# Patient Record
Sex: Female | Born: 1970 | Race: White | Hispanic: No | Smoking: Current every day smoker
Health system: Southern US, Community
[De-identification: ages and names within clinical notes are randomized; demographics above are authoritative.]

## PROBLEM LIST (undated history)

## (undated) DIAGNOSIS — D649 Anemia, unspecified: Secondary | ICD-10-CM

## (undated) DIAGNOSIS — I10 Essential (primary) hypertension: Secondary | ICD-10-CM

## (undated) DIAGNOSIS — F32A Depression, unspecified: Secondary | ICD-10-CM

## (undated) DIAGNOSIS — F419 Anxiety disorder, unspecified: Secondary | ICD-10-CM

## (undated) DIAGNOSIS — E079 Disorder of thyroid, unspecified: Secondary | ICD-10-CM

## (undated) DIAGNOSIS — F329 Major depressive disorder, single episode, unspecified: Secondary | ICD-10-CM

## (undated) DIAGNOSIS — M069 Rheumatoid arthritis, unspecified: Secondary | ICD-10-CM

## (undated) HISTORY — DX: Anemia, unspecified: D64.9

## (undated) HISTORY — PX: TUBAL LIGATION: SHX77

## (undated) HISTORY — DX: Anxiety disorder, unspecified: F41.9

## (undated) HISTORY — DX: Rheumatoid arthritis, unspecified: M06.9

## (undated) HISTORY — PX: CHOLECYSTECTOMY: SHX55

## (undated) HISTORY — PX: ENDOMETRIAL ABLATION: SHX621

---

## 2002-12-08 ENCOUNTER — Encounter (INDEPENDENT_AMBULATORY_CARE_PROVIDER_SITE_OTHER): Payer: Self-pay | Admitting: *Deleted

## 2002-12-08 ENCOUNTER — Ambulatory Visit (HOSPITAL_BASED_OUTPATIENT_CLINIC_OR_DEPARTMENT_OTHER): Admission: RE | Admit: 2002-12-08 | Discharge: 2002-12-08 | Payer: Self-pay | Admitting: General Surgery

## 2003-03-01 ENCOUNTER — Ambulatory Visit (HOSPITAL_COMMUNITY): Admission: RE | Admit: 2003-03-01 | Discharge: 2003-03-01 | Payer: Self-pay | Admitting: Obstetrics & Gynecology

## 2004-02-06 ENCOUNTER — Ambulatory Visit (HOSPITAL_BASED_OUTPATIENT_CLINIC_OR_DEPARTMENT_OTHER): Admission: RE | Admit: 2004-02-06 | Discharge: 2004-02-06 | Payer: Self-pay | Admitting: General Surgery

## 2004-07-30 ENCOUNTER — Emergency Department (HOSPITAL_COMMUNITY): Admission: EM | Admit: 2004-07-30 | Discharge: 2004-07-30 | Payer: Self-pay | Admitting: Emergency Medicine

## 2006-03-31 ENCOUNTER — Ambulatory Visit: Payer: Self-pay | Admitting: Internal Medicine

## 2006-11-10 ENCOUNTER — Emergency Department (HOSPITAL_COMMUNITY): Admission: EM | Admit: 2006-11-10 | Discharge: 2006-11-10 | Payer: Self-pay | Admitting: Emergency Medicine

## 2007-03-03 ENCOUNTER — Emergency Department (HOSPITAL_COMMUNITY): Admission: EM | Admit: 2007-03-03 | Discharge: 2007-03-03 | Payer: Self-pay | Admitting: Emergency Medicine

## 2007-08-27 ENCOUNTER — Emergency Department (HOSPITAL_COMMUNITY): Admission: EM | Admit: 2007-08-27 | Discharge: 2007-08-28 | Payer: Self-pay | Admitting: Emergency Medicine

## 2010-02-16 ENCOUNTER — Emergency Department (HOSPITAL_COMMUNITY): Admission: EM | Admit: 2010-02-16 | Discharge: 2010-02-16 | Payer: Self-pay | Admitting: Emergency Medicine

## 2011-05-09 NOTE — Op Note (Signed)
Kathy Singh, Kathy Singh                       ACCOUNT NO.:  [id number]   MEDICAL RECORD NO.:  [id number]                   PATIENT TYPE:  AMB   LOCATION:  DSC                                  FACILITY:  [facility]   PHYSICIAN:  Gita Kudo, M.D.              DATE OF BIRTH:  Dec 04, 1971   DATE OF PROCEDURE:  02/06/2004  DATE OF DISCHARGE:                                 OPERATIVE REPORT   PROCEDURE PERFORMED:  Excision recurrent sebaceous cyst, occiput.   SURGEON:  Gita Kudo, M.D.   ANESTHESIA:  1% Xylocaine with epinephrine.   DESCRIPTION OF PROCEDURE:  The patient was positioned in the prone position  and her head shaved appropriately.  The area was prepped with Betadine and  infiltrated with local anesthesia for good analgesia.  Then the area in  question which had been previously palpated was excised with an ellipse  encompassing the external draining site and internally the entire cyst.  It  was a recurrent cyst and I believe that we got it all out.  There was some  scalp bleeding.  There was no gross pus and I removed the area totally--so  therefore, I thought it safe to close it.  It also acted for hemostasis.  The wound was closed with three widely placed 2-0 Monocryl mattress sutures,  a light pressure dressing applied.  Written instructions given.  The patient  will be followed in the office.                                               Gita Kudo, M.D.    MRL/MEDQ  D:  02/06/2004  T:  02/06/2004  Job:  [id number]

## 2011-05-09 NOTE — Op Note (Signed)
   NAMEAYLINE, DINGUS                       ACCOUNT NO.:  [id number]   MEDICAL RECORD NO.:  [id number]                   PATIENT TYPE:  AMB   LOCATION:  DSC                                  FACILITY:  [facility]   PHYSICIAN:  Gabrielle Dare. Janee Morn, M.D.             DATE OF BIRTH:  December 11, 1971   DATE OF PROCEDURE:  12/08/2002  DATE OF DISCHARGE:                                 OPERATIVE REPORT   PREOPERATIVE DIAGNOSIS:  Mass on the occiput.   POSTOPERATIVE DIAGNOSIS:  Sebaceous cyst on the occiput.   PROCEDURE:  Excision of sebaceous cyst on the occiput.   SURGEON:  Gabrielle Dare. Janee Morn, M.D.   ANESTHESIA:  Monitored anesthesia care.   HISTORY OF PRESENT ILLNESS:  The patient is a 40 year old female who  presented complaining of an enlarging mass on her occiput, and she was  scheduled for elective excision.   PROCEDURE IN DETAIL:  The patient was brought to the operating room.  She  had received intravenous antibiotics.  The area around this mass was shaved.  The mass was about 3 cm in size.  Subsequently, the area was prepped and  draped in sterile fashion, and 0.25% Marcaine with epinephrine was given for  local anesthetic.  MAC anesthesia was administered.  Subsequently, an  incision was made transversely across this lesion.  It was noted to be more  consistent with a sebaceous cyst.  A portion of it was opened during the  dissection when some fluid and sebaceous material were drained out.  Subsequently, the cyst itself was removed sharply circumferentially in order  to obtain all portions.  It did fragment in a couple of places; but under  close inspection, the entire cyst was removed.  The area was then copiously  irrigated.  Bovie cautery was used for good hemostasis.  Subsequently, some  further local anesthetic was injected.  The wound was again irrigated and re-  checked for hemostasis.  This was obtained with Bovie cautery.  The wound  was subsequently closed in the following  fashion:  A 3-0 Vicryl suture was  used to approximate the subcutaneous tissues and tack them down to the  underlying tissue to close down the cavity.  Then the skin was closed with a  series of interrupted 3-0 nylon sutures.  Antibiotic ointment was applied.  The sponge and needle counts were all correct. The patient tolerated the  procedure well and was taken to the recovery room in stable condition.                                               Gabrielle Dare Janee Morn, M.D.    BET/MEDQ  D:  12/08/2002  T:  12/09/2002  Job:  [id number]

## 2011-05-09 NOTE — Op Note (Signed)
   NAME:  Kathy Singh, Kathy Singh                       ACCOUNT NO.:  [id number]   MEDICAL RECORD NO.:  [id number]                   PATIENT TYPE:  AMB   LOCATION:  DAY                                  FACILITY:  [facility]   PHYSICIAN:  Lazaro Arms, M.D.                DATE OF BIRTH:  1971-03-16   DATE OF PROCEDURE:  03/01/2003  DATE OF DISCHARGE:                                 OPERATIVE REPORT   PREOPERATIVE DIAGNOSIS:  1. Hypermenorrhea.  2. Dysmenorrhea.  3. Irregular menstrual bleeding.   POSTOPERATIVE DIAGNOSES:  1. Hypermenorrhea.  2. Dysmenorrhea.  3. Irregular menstrual bleeding.   PROCEDURE:  A hysteroscopy and D&C with endometrial ablation.   SURGEON:  Lazaro Arms, M.D.   ANESTHESIA:  General endotracheal.   FINDINGS:  The patient had a normal endometrium.  She has recently been  menstruating.  There were no polyps, no other abnormalities seen, no  fibroids.   DESCRIPTION OF PROCEDURE:  The patient was taken to the operating room and  placed in the supine position where she underwent general endotracheal  anesthesia.  She was placed in the dorsal lithotomy position and prepped and  draped in the usual sterile fashion.  The bladder was drained.  Drapes were  placed.   The cervix was grasped with a single-tooth tenaculum.  Marcaine 0.5% 10 cc  with 1:200,000 epinephrine was placed bilaterally as a paracervical block.  The cervix was dilated serially.  The uterus sounded to 11 cm.  A  hysteroscopy was performed.  No abnormal findings.  D&C was performed  without difficulty, with good uterine cry in all areas.  Endometrial  ablation was then performed using the ThermaChoice system.  It was primed  with 15 cc and taken to a -150 mmHg pressure and placed in the endometrial  cavity, 22 cc was required to distend the endometrium to maintain a pressure  of above 160 mmHg.  It was then heated up to 170 degrees Fahrenheit for a  total of 8 minutes, total therapy time of 10  minutes 23 seconds.  The  patient tolerated the procedure well.  All fluid was removed from the  endometrial ablation catheter and was accounted for.  The patient was taken  to the recovery room in good and stable condition.  All counts were correct  x3.                                               Lazaro Arms, M.D.   Loraine Maple  D:  03/01/2003  T:  03/01/2003  Job:  [id number]

## 2011-10-03 LAB — BASIC METABOLIC PANEL
CO2: 22
Calcium: 8.9
GFR calc Af Amer: >60
Potassium: 3.2 — ABNORMAL LOW
Sodium: 137

## 2011-10-03 LAB — URINALYSIS, ROUTINE W REFLEX MICROSCOPIC
Bilirubin Urine: NEGATIVE
Glucose, UA: NEGATIVE
Specific Gravity, Urine: 1.015
pH: 6

## 2011-10-03 LAB — DIFFERENTIAL
Basophils Relative: 0
Eosinophils Absolute: 0.1
Lymphocytes Relative: 22
Neutrophils Relative %: 73

## 2011-10-03 LAB — CBC
HCT: 42.5
Hemoglobin: 14.4
MCHC: 34
MCV: 87.6
Platelets: 186
RBC: 4.85
RDW: 14.9 — ABNORMAL HIGH

## 2011-10-03 LAB — URINE MICROSCOPIC-ADD ON

## 2012-03-06 ENCOUNTER — Encounter (HOSPITAL_COMMUNITY): Payer: Self-pay | Admitting: *Deleted

## 2012-03-06 ENCOUNTER — Emergency Department (HOSPITAL_COMMUNITY): Payer: Self-pay

## 2012-03-06 ENCOUNTER — Emergency Department (HOSPITAL_COMMUNITY)
Admission: EM | Admit: 2012-03-06 | Discharge: 2012-03-06 | Disposition: A | Discharge status: Home / Self Care | Payer: Self-pay | Attending: Emergency Medicine | Admitting: Emergency Medicine

## 2012-03-06 ENCOUNTER — Other Ambulatory Visit: Payer: Self-pay

## 2012-03-06 DIAGNOSIS — Z9889 Other specified postprocedural states: Secondary | ICD-10-CM | POA: Insufficient documentation

## 2012-03-06 DIAGNOSIS — R0602 Shortness of breath: Secondary | ICD-10-CM | POA: Insufficient documentation

## 2012-03-06 DIAGNOSIS — F17200 Nicotine dependence, unspecified, uncomplicated: Secondary | ICD-10-CM | POA: Insufficient documentation

## 2012-03-06 DIAGNOSIS — R61 Generalized hyperhidrosis: Secondary | ICD-10-CM | POA: Insufficient documentation

## 2012-03-06 DIAGNOSIS — R748 Abnormal levels of other serum enzymes: Secondary | ICD-10-CM

## 2012-03-06 DIAGNOSIS — E079 Disorder of thyroid, unspecified: Secondary | ICD-10-CM | POA: Insufficient documentation

## 2012-03-06 DIAGNOSIS — R079 Chest pain, unspecified: Secondary | ICD-10-CM | POA: Insufficient documentation

## 2012-03-06 DIAGNOSIS — M069 Rheumatoid arthritis, unspecified: Secondary | ICD-10-CM | POA: Insufficient documentation

## 2012-03-06 HISTORY — DX: Disorder of thyroid, unspecified: E07.9

## 2012-03-06 LAB — DIFFERENTIAL
Eosinophils Absolute: 0.1 10*3/uL (ref 0.0–0.7)
Lymphocytes Relative: 27 % (ref 12–46)
Lymphs Abs: 1.3 10*3/uL (ref 0.7–4.0)
Neutrophils Relative %: 64 % (ref 43–77)

## 2012-03-06 LAB — COMPREHENSIVE METABOLIC PANEL
ALT: 272 U/L — ABNORMAL HIGH (ref 0–35)
Alkaline Phosphatase: 126 U/L — ABNORMAL HIGH (ref 39–117)
BUN: 8 mg/dL (ref 6–23)
Calcium: 9.6 mg/dL (ref 8.4–10.5)
Creatinine, Ser: 0.6 mg/dL (ref 0.50–1.10)
GFR calc non Af Amer: >90 mL/min (ref >90)
Glucose, Bld: 109 mg/dL — ABNORMAL HIGH (ref 70–99)
Total Bilirubin: 0.4 mg/dL (ref 0.3–1.2)
Total Protein: 7.9 g/dL (ref 6.0–8.3)

## 2012-03-06 LAB — CBC
HCT: 42.9 % (ref 36.0–46.0)
MCH: 30.2 pg (ref 26.0–34.0)
MCHC: 34 g/dL (ref 30.0–36.0)
Platelets: 206 10*3/uL (ref 150–400)
RDW: 13.8 % (ref 11.5–15.5)

## 2012-03-06 MED ORDER — OXYCODONE-ACETAMINOPHEN 5-325 MG PO TABS: 1.0000 | ORAL_TABLET | Freq: Four times a day (QID) | ORAL | Status: AC | PRN

## 2012-03-06 MED ORDER — MORPHINE SULFATE 2 MG/ML IJ SOLN
2.0000 mg | Freq: Once | INTRAMUSCULAR | Status: AC
  Administered 2012-03-06: 2 mg via INTRAVENOUS
  Filled 2012-03-06: qty 1

## 2012-03-06 MED ORDER — RANITIDINE HCL 150 MG PO CAPS: 150.0000 mg | ORAL_CAPSULE | Freq: Two times a day (BID) | ORAL | Status: AC

## 2012-03-06 MED ORDER — ASPIRIN 325 MG PO TABS
325.0000 mg | ORAL_TABLET | Freq: Once | ORAL | Status: AC
  Administered 2012-03-06: 325 mg via ORAL
  Filled 2012-03-06: qty 1

## 2012-03-06 MED ORDER — IOHEXOL 350 MG/ML SOLN
100.0000 mL | Freq: Once | INTRAVENOUS | Status: AC | PRN
  Administered 2012-03-06: 100 mL via INTRAVENOUS

## 2012-03-06 NOTE — ED Provider Notes (Signed)
History  Scribed for Kathy Lennert, MD, the patient was seen in room APA18/APA18. This chart was scribed by Kathy Singh. The patient's care started at 9:26 AM    CSN: [id number]  Arrival date & time 03/06/12  0909   First MD Initiated Contact with Patient 03/06/12 [phone]      Chief Complaint  Patient presents with  . Chest Pain     Patient is a 41 y.o. female presenting with chest pain. The history is provided by the patient.  Chest Pain The chest pain began yesterday. Chest pain occurs intermittently. The chest pain is unchanged. Associated with: nothing. The severity of the pain is moderate. The quality of the pain is described as aching and heavy. The pain radiates to the left arm. Exacerbated by: nothing. Primary symptoms include shortness of breath.  Associated symptoms include diaphoresis. She tried nothing for the symptoms. Risk factors include smoking/tobacco exposure (family history ).  Her family medical history is significant for CAD in family.    Kathy Singh is a 41 y.o. female who presents to the Emergency Department complaining of intermittent episodes of chest pain in the middle of her chest yesterday and once this morning.  She is also experiencing diaphoresis, SOB, and radiation of pain to her neck and arms.  She denies ever having these pains before.  She did not take anything for the sx and nothing seems to make better or worse.  She has family h/o of heart disease, both parents with her father having open heart surgery at 79.  Pt is a smoker and currently does not have a PCP.  She has a h/o rheumatoid arthritis.  She also states that for the last week she has not been feeling well.        Past Medical History  Diagnosis Date  . Arthritis   . Thyroid disease     Past Surgical History  Procedure Date  . Cholecystectomy   . Cesarean section     History reviewed. No pertinent family history.  History  Substance Use Topics  . Smoking status: Current  Everyday Smoker -- 1.0 packs/day  . Smokeless tobacco: Not on file  . Alcohol Use: Yes     occasionally    OB History    Grav Para Term Preterm Abortions TAB SAB Ect Mult Living                  Review of Systems  Constitutional: Positive for diaphoresis.  Respiratory: Positive for shortness of breath.   Cardiovascular: Positive for chest pain. Negative for leg swelling.  All other systems reviewed and are negative.    Allergies  Review of patient's allergies indicates not on file.  Home Medications  No current outpatient prescriptions on file.  BP 136/87  Pulse 84  Temp(Src) 97.8 F (36.6 C) (Oral)  Resp 18  Ht 5\' 4"  (1.626 m)  Wt 235 lb (106.595 kg)  BMI 40.34 kg/m2  SpO2 96%  Physical Exam  Nursing note and vitals reviewed. Constitutional: She is oriented to person, place, and time. She appears well-developed and well-nourished. No distress.  HENT:  Head: Normocephalic and atraumatic.  Eyes: EOM are normal. Right eye exhibits no discharge. Left eye exhibits no discharge.  Neck: Normal range of motion. Neck supple.  Cardiovascular: Normal rate and regular rhythm.   Pulmonary/Chest: Effort normal. No respiratory distress.  Abdominal: She exhibits no mass. There is no tenderness.  Musculoskeletal: She exhibits no edema.  Neurological: She  is alert and oriented to person, place, and time.  Skin: Skin is warm and dry. She is not diaphoretic.  Psychiatric: She has a normal mood and affect. Her behavior is normal.    ED Course  Procedures   DIAGNOSTIC STUDIES: Oxygen Saturation is 96% on room air, normal by my interpretation.    COORDINATION OF CARE:  9:35AM Ordered: CBC ; Differential ; Comprehensive metabolic panel ; Troponin I ; morphine 2 MG/ML injection 2 mg ; aspirin tablet 325 mg ; DG Chest 2 View; D-DIMER, QUANTITATIVE   10:57AM Ordered: CT Angio Chest W/Cm &/Or Wo Cm  12:46PM Ordered: Troponin I  2:05 PM Recheck: Discussed course of care with  pt.     Labs Reviewed  COMPREHENSIVE METABOLIC PANEL - Abnormal; Notable for the following:    Sodium 133 (*)    Glucose, Bld 109 (*)    AST 228 (*)    ALT 272 (*)    Alkaline Phosphatase 126 (*)    All other components within normal limits  D-DIMER, QUANTITATIVE - Abnormal; Notable for the following:    D-Dimer, Quant 3.03 (*)    All other components within normal limits  CBC  DIFFERENTIAL  TROPONIN I    Dg Chest 2 View  03/06/2012  *RADIOLOGY REPORT*  Clinical Data: Pain  CHEST - 2 VIEW  Comparison: 10/05/2009  Findings: Cardiomediastinal silhouette is stable.  No acute infiltrate or pleural effusion.  No pulmonary edema.  Mild degenerative changes thoracic spine.  IMPRESSION: No active disease.  No significant change.  Original Report Authenticated By: Natasha Mead, M.D.   Ct Angio Chest W/cm &/or Wo Cm  03/06/2012  *RADIOLOGY REPORT*  Clinical Data: Chest pain, shortness of breath  CT ANGIOGRAPHY CHEST  Technique:  Multidetector CT imaging of the chest using the standard protocol during bolus administration of intravenous contrast. Multiplanar reconstructed images including MIPs were obtained and reviewed to evaluate the vascular anatomy.  Contrast: OMNIPAQUE IOHEXOL 350 MG/ML IV SOLN  Comparison: None.  Findings: Images of the thoracic inlet are unremarkable.  Central airways are patent.  Central thoracic aorta is unremarkable.  There is no evidence of aneurysm.  The pulmonary artery is unremarkable. No pulmonary embolus is identified.  Small hiatal hernia is noted.  There is no mediastinal hematoma or adenopathy.  No hilar adenopathy.  The visualized upper abdomen is unremarkable.  The patient is status post cholecystectomy.  Images of the lung parenchyma shows no acute infiltrate or pleural effusion.  No pulmonary edema.  There is no focal consolidation. No pneumothorax.  No destructive bony lesions are noted.  IMPRESSION:  1.  No pulmonary embolus. 2.  No acute infiltrate or  pulmonary edema.  No adenopathy. 3.  Mild degenerative changes thoracic spine.  Original Report Authenticated By: Natasha Mead, M.D.     No results found.   No diagnosis found.   Date: 03/06/2012  Rate: 82  Rhythm: normal sinus rhythm  QRS Axis: normal  Intervals: normal  ST/T Wave abnormalities: nonspecific ST changes  Conduction Disutrbances:nonspecific intraventricular conduction delay  Narrative Interpretation:   Old EKG Reviewed: none available    MDM  Chest pain normal troponin times two.  Pt does not want admission now.  Will follow up with out pt work up The chart was scribed for me under my direct supervision.  I personally performed the history, physical, and medical decision making and all procedures in the evaluation of this patient.Marland Kitchen  Kathy Lennert, MD 03/06/12 [phone]

## 2012-03-06 NOTE — Discharge Instructions (Signed)
Take one baby aspirin a day.  Follow up with [facility] in 2 weeks.  Dr. Dietrich Pates is a cardiologist for [facility].  Follow up with Dr. Karilyn Cota for elevated liver tests

## 2012-03-06 NOTE — ED Notes (Signed)
Pt c/o cp. Pt states pain began last pm and has come and gone since. Pt describes pain as aching and heavy. Pt states she has had hot/cold flashes. Pt states pain radiates to her neck.

## 2012-12-27 IMAGING — CT CT ANGIO CHEST
2 of 6 series · 6 of 36 positions shown · IV contrast (Omnipaque 300)
Comparison: None.

CLINICAL DATA: Chest pain, shortness of breath

CT ANGIOGRAPHY CHEST
TECHNIQUE: Multidetector CT imaging of the chest using the
standard protocol during bolus administration of intravenous
contrast. Multiplanar reconstructed images including MIPs were
obtained and reviewed to evaluate the vascular anatomy.
Contrast: 100mL OMNIPAQUE IOHEXOL 350 MG/ML IV SOLN

[Series 4: pe 3.0 b40f · axial · 0.69mm/px · z∈[-246,-54]mm · 5 of 96 slices shown]
[im 16/96  lung]
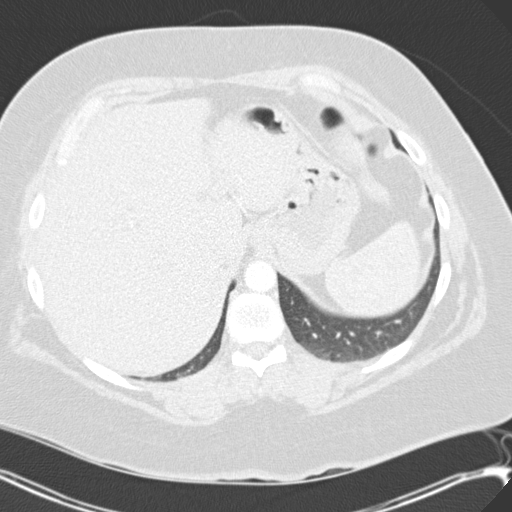
[im 32/96  mediastinal]
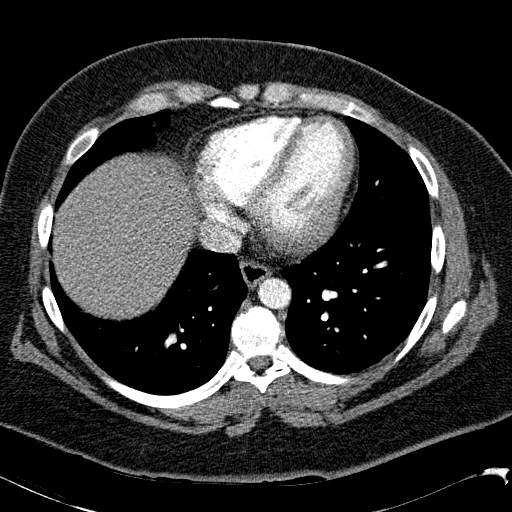
[im 48/96  lung]
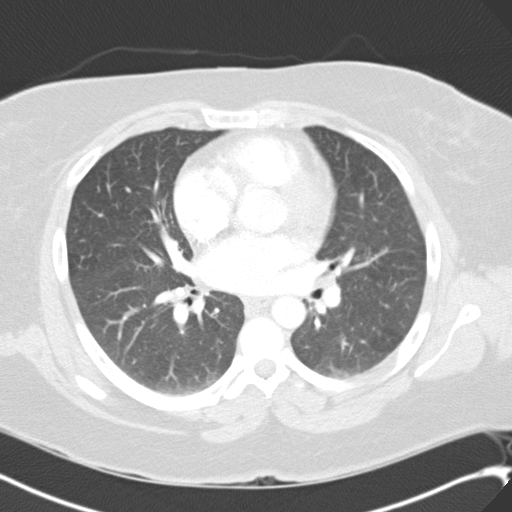
[im 64/96  mediastinal]
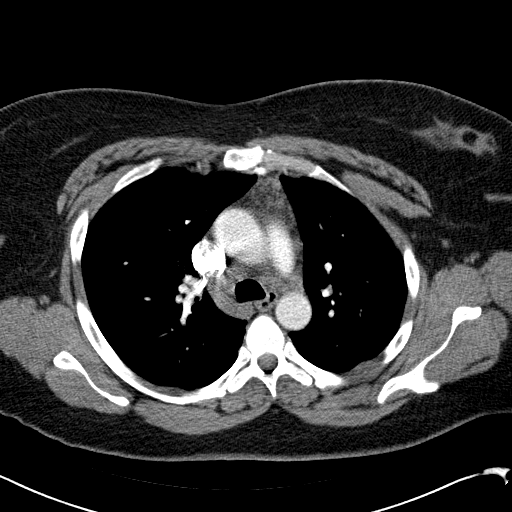
[im 80/96  lung]
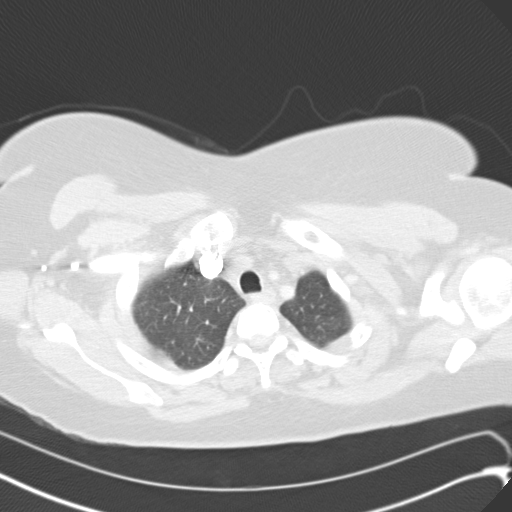

[Series 6: mpr coronal pe 3mm · coronal · 0.58mm/px · 1 of 87 slices shown]
[im 44/87  mediastinal]
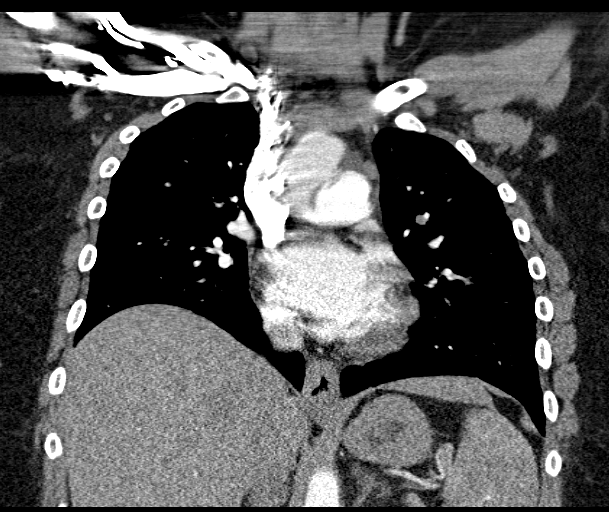

[6 of 36 positions shown; findings below may reference images not displayed]

FINDINGS: Images of the thoracic inlet are unremarkable.  Central
airways are patent.  Central thoracic aorta is unremarkable.  There
is no evidence of aneurysm.  The pulmonary artery is unremarkable.
No pulmonary embolus is identified.

Small hiatal hernia is noted.

There is no mediastinal hematoma or adenopathy.  No hilar
adenopathy.

The visualized upper abdomen is unremarkable.

The patient is status post cholecystectomy.

Images of the lung parenchyma shows no acute infiltrate or pleural
effusion.  No pulmonary edema.  There is no focal consolidation.
No pneumothorax.  No destructive bony lesions are noted.
IMPRESSION: 1.  No pulmonary embolus.
2.  No acute infiltrate or pulmonary edema.  No adenopathy.
3.  Mild degenerative changes thoracic spine.

## 2013-03-09 ENCOUNTER — Emergency Department (HOSPITAL_COMMUNITY)
Admission: EM | Admit: 2013-03-09 | Discharge: 2013-03-09 | Disposition: A | Discharge status: Home / Self Care | Payer: Medicaid Other | Attending: Emergency Medicine | Admitting: Emergency Medicine

## 2013-03-09 ENCOUNTER — Encounter (HOSPITAL_COMMUNITY): Payer: Self-pay

## 2013-03-09 DIAGNOSIS — M1990 Unspecified osteoarthritis, unspecified site: Secondary | ICD-10-CM

## 2013-03-09 DIAGNOSIS — R21 Rash and other nonspecific skin eruption: Secondary | ICD-10-CM | POA: Insufficient documentation

## 2013-03-09 DIAGNOSIS — M129 Arthropathy, unspecified: Secondary | ICD-10-CM | POA: Insufficient documentation

## 2013-03-09 DIAGNOSIS — Z8639 Personal history of other endocrine, nutritional and metabolic disease: Secondary | ICD-10-CM | POA: Insufficient documentation

## 2013-03-09 DIAGNOSIS — F17200 Nicotine dependence, unspecified, uncomplicated: Secondary | ICD-10-CM | POA: Insufficient documentation

## 2013-03-09 DIAGNOSIS — M542 Cervicalgia: Secondary | ICD-10-CM | POA: Insufficient documentation

## 2013-03-09 DIAGNOSIS — Z862 Personal history of diseases of the blood and blood-forming organs and certain disorders involving the immune mechanism: Secondary | ICD-10-CM | POA: Insufficient documentation

## 2013-03-09 LAB — GLUCOSE, CAPILLARY: Glucose-Capillary: 78 mg/dL (ref 70–99)

## 2013-03-09 MED ORDER — PREDNISONE 50 MG PO TABS
60.0000 mg | ORAL_TABLET | Freq: Once | ORAL | Status: AC
  Administered 2013-03-09: 60 mg via ORAL
  Filled 2013-03-09: qty 1

## 2013-03-09 MED ORDER — OXYCODONE-ACETAMINOPHEN 5-325 MG PO TABS: 1.0000 | ORAL_TABLET | ORAL | Status: DC | PRN

## 2013-03-09 MED ORDER — OXYCODONE-ACETAMINOPHEN 5-325 MG PO TABS
1.0000 | ORAL_TABLET | Freq: Once | ORAL | Status: AC
  Administered 2013-03-09: 1 via ORAL
  Filled 2013-03-09: qty 1

## 2013-03-09 MED ORDER — PREDNISONE 50 MG PO TABS: ORAL_TABLET | ORAL | Status: DC

## 2013-03-09 NOTE — ED Provider Notes (Signed)
History     This chart was scribed for Joya Gaskins, MD, MD by Smitty Pluck, ED Scribe. The patient was seen in room APA11/APA11 and the patient's care was started at 12:46 PM.   CSN: [id number]  Arrival date & time 03/09/13  1126      Chief Complaint  Patient presents with  . Joint Pain  . Rash     Patient is a 42 y.o. female presenting with rash. The history is provided by the patient. No language interpreter was used.  Rash Location:  Face Facial rash location:  L cheek Severity:  Mild Onset quality:  Sudden Timing:  Intermittent Progression:  Waxing and waning Chronicity:  New Context: not animal contact, not chemical exposure, not exposure to similar rash, not food, not insect bite/sting, not medications and not new detergent/soap   Relieved by:  None tried Worsened by:  Nothing tried Ineffective treatments:  None tried Associated symptoms: joint pain    Kathy Singh is a 42 y.o. female with hx of RA who presents to the Emergency Department complaining of constant, moderate bilateral knee pain , bilateral wrists and neck pain onset 1 day ago. She reports having burning sensation in knees and neck. She reports having facial rash on left cheek onset 1 week ago but symptoms seemed to improved then reappeared last night. She states the rash is itchy.  Pt denies injury, fall, fever, chills, nausea, vomiting, diarrhea, weakness, cough, and any other pain.   Past Medical History  Diagnosis Date  . Arthritis   . Thyroid disease     Past Surgical History  Procedure Laterality Date  . Cholecystectomy    . Cesarean section    . Endometrial ablation      No family history on file.  History  Substance Use Topics  . Smoking status: Current Every Day Smoker -- 1.00 packs/day  . Smokeless tobacco: Not on file  . Alcohol Use: Yes     Comment: occasionally    OB History   Grav Para Term Preterm Abortions TAB SAB Ect Mult Living                  Review of  Systems  HENT: Positive for neck pain.   Musculoskeletal: Positive for arthralgias.  Skin: Positive for rash.  All other systems reviewed and are negative.    Allergies  Penicillins  Home Medications   Current Outpatient Rx  Name  Route  Sig  Dispense  Refill  . acetaminophen (TYLENOL) 500 MG tablet   Oral   Take 1,000 mg by mouth every 6 (six) hours as needed. For pain         . ibuprofen (ADVIL,MOTRIN) 200 MG tablet   Oral   Take 800 mg by mouth every 6 (six) hours as needed. For pain           BP 125/89  Pulse 80  Temp(Src) 97.5 F (36.4 C) (Oral)  Resp 20  Ht 5\' 6"  (1.676 m)  Wt 230 lb (104.327 kg)  BMI 37.14 kg/m2  SpO2 97%  Physical Exam  Nursing note and vitals reviewed. CONSTITUTIONAL: Well developed/well nourished HEAD: Normocephalic/atraumatic EYES: EOMI/PERRL ENMT: Mucous membranes moist, healing scab to left cheek, no signs of abscess or cellulitis, no crepitance or facial swelling noted  NECK: supple no meningeal signs SPINE:entire spine nontender CV: S1/S2 noted, no murmurs/rubs/gallops noted LUNGS: Lungs are clear to auscultation bilaterally, no apparent distress ABDOMEN: soft, nontender, no rebound or guarding GU:no  cva tenderness NEURO: Pt is awake/alert, moves all extremitiesx4, pt is ambulatory EXTREMITIES: pulses normal, full ROM, tenderness to bilateral wrists, no warmth or edema, no tenderness noted lower extremities SKIN: warm, color normal PSYCH: no abnormalities of mood noted   ED Course  Procedures (including critical care time) DIAGNOSTIC STUDIES: Oxygen Saturation is 97% on room air, normal by my interpretation.    COORDINATION OF CARE: 12:47 PM Discussed ED treatment with pt and pt agrees.   Pt reports h/o rheumatoid arthritis but unable to f/u as outpatient.  Her joints are tender but no warmth/erythema to suggest infectious etiology.  Short course of pain meds and f/u as outpatient     MDM  Nursing notes including  past medical history and social history reviewed and considered in documentation Narcotic database reviewed      I personally performed the services described in this documentation, which was scribed in my presence. The recorded information has been reviewed and is accurate.      Joya Gaskins, MD 03/09/13 [phone]

## 2013-03-09 NOTE — ED Notes (Signed)
Pt reports hx of rheumatoid arthritis.  Pt reports severe pain to her wrist, neck, knees, and ankles.  Pt also reports possible "infection" to her face.  Pt has small red areas to her face.

## 2013-03-09 NOTE — ED Notes (Signed)
Patient with no complaints at this time. Respirations even and unlabored. Skin warm/dry. Discharge instructions reviewed with patient at this time. Patient given opportunity to voice concerns/ask questions. Patient discharged at this time and left Emergency Department with steady gait. Patient provided information about Health department and [facility].

## 2016-06-13 ENCOUNTER — Emergency Department (HOSPITAL_COMMUNITY)
Admission: EM | Admit: 2016-06-13 | Discharge: 2016-06-14 | Disposition: A | Discharge status: Home / Self Care | Payer: Medicaid Other | Attending: Emergency Medicine | Admitting: Emergency Medicine

## 2016-06-13 ENCOUNTER — Encounter (HOSPITAL_COMMUNITY): Payer: Self-pay | Admitting: *Deleted

## 2016-06-13 DIAGNOSIS — G8929 Other chronic pain: Secondary | ICD-10-CM | POA: Insufficient documentation

## 2016-06-13 DIAGNOSIS — R21 Rash and other nonspecific skin eruption: Secondary | ICD-10-CM | POA: Insufficient documentation

## 2016-06-13 DIAGNOSIS — M069 Rheumatoid arthritis, unspecified: Secondary | ICD-10-CM

## 2016-06-13 DIAGNOSIS — F329 Major depressive disorder, single episode, unspecified: Secondary | ICD-10-CM | POA: Insufficient documentation

## 2016-06-13 DIAGNOSIS — F17210 Nicotine dependence, cigarettes, uncomplicated: Secondary | ICD-10-CM | POA: Insufficient documentation

## 2016-06-13 DIAGNOSIS — I10 Essential (primary) hypertension: Secondary | ICD-10-CM | POA: Insufficient documentation

## 2016-06-13 DIAGNOSIS — M06832 Other specified rheumatoid arthritis, left wrist: Secondary | ICD-10-CM | POA: Insufficient documentation

## 2016-06-13 DIAGNOSIS — Z79899 Other long term (current) drug therapy: Secondary | ICD-10-CM | POA: Insufficient documentation

## 2016-06-13 HISTORY — DX: Major depressive disorder, single episode, unspecified: F32.9

## 2016-06-13 HISTORY — DX: Essential (primary) hypertension: I10

## 2016-06-13 HISTORY — DX: Depression, unspecified: F32.A

## 2016-06-13 LAB — LIPASE, BLOOD: Lipase: 20 U/L (ref 11–51)

## 2016-06-13 LAB — URINALYSIS, ROUTINE W REFLEX MICROSCOPIC
GLUCOSE, UA: NEGATIVE mg/dL
HGB URINE DIPSTICK: NEGATIVE
Ketones, ur: 15 mg/dL — AB
Leukocytes, UA: NEGATIVE
Nitrite: NEGATIVE
PH: 6 (ref 5.0–8.0)
Protein, ur: NEGATIVE mg/dL
SPECIFIC GRAVITY, URINE: 1.034 — AB (ref 1.005–1.030)

## 2016-06-13 LAB — CBC
HCT: 41.9 % (ref 36.0–46.0)
HEMOGLOBIN: 13.8 g/dL (ref 12.0–15.0)
MCH: 29.4 pg (ref 26.0–34.0)
MCHC: 32.9 g/dL (ref 30.0–36.0)
MCV: 89.3 fL (ref 78.0–100.0)
Platelets: 257 10*3/uL (ref 150–400)
RBC: 4.69 MIL/uL (ref 3.87–5.11)
RDW: 15.1 % (ref 11.5–15.5)
WBC: 8.1 10*3/uL (ref 4.0–10.5)

## 2016-06-13 LAB — COMPREHENSIVE METABOLIC PANEL
ALBUMIN: 3.8 g/dL (ref 3.5–5.0)
ALK PHOS: 81 U/L (ref 38–126)
ALT: 18 U/L (ref 14–54)
ANION GAP: 7 (ref 5–15)
AST: 19 U/L (ref 15–41)
BUN: 5 mg/dL — ABNORMAL LOW (ref 6–20)
CALCIUM: 9.2 mg/dL (ref 8.9–10.3)
CHLORIDE: 104 mmol/L (ref 101–111)
CO2: 25 mmol/L (ref 22–32)
Creatinine, Ser: 0.62 mg/dL (ref 0.44–1.00)
GFR calc Af Amer: >60 mL/min (ref >60)
GFR calc non Af Amer: >60 mL/min (ref >60)
GLUCOSE: 90 mg/dL (ref 65–99)
Potassium: 3.6 mmol/L (ref 3.5–5.1)
SODIUM: 136 mmol/L (ref 135–145)
Total Bilirubin: 0.2 mg/dL — ABNORMAL LOW (ref 0.3–1.2)
Total Protein: 7.1 g/dL (ref 6.5–8.1)

## 2016-06-13 MED ORDER — ONDANSETRON 4 MG PO TBDP: 4.0000 mg | ORAL_TABLET | Freq: Three times a day (TID) | ORAL | Status: DC | PRN

## 2016-06-13 MED ORDER — SODIUM CHLORIDE 0.9 % IV BOLUS (SEPSIS)
1000.0000 mL | Freq: Once | INTRAVENOUS | Status: AC
  Administered 2016-06-13: 1000 mL via INTRAVENOUS

## 2016-06-13 NOTE — ED Provider Notes (Signed)
CSN: [id number]     Arrival date & time 06/13/16  1810 History   First MD Initiated Contact with Patient 06/13/16 2222     Chief Complaint  Patient presents with  . Arthritis  . Rash  . Emesis     (Consider location/radiation/quality/duration/timing/severity/associated sxs/prior Treatment) HPI Comments: Patient with a history of RA, thyroid disease, HTN, depression presents with complaint of left greater than right wrist pain, worse than chronic pain with RA, over the last 4 days. She states her left wrist is "grinding" when she flexes. No fever or injury. She also complains of generalized rash that started 4 weeks ago. Her PCP started her on Septra DS (stopping her methotrexate) which she completed 3 days ago. The rash is no different and is persistent. She reports the rash itches and keeps her awake at night, as does her joint pain. She also complains of morning nausea with 1 episode vomiting each day. This has been going on "a long time". She states that over the last 4 days she has lost the ability to taste any type of food, and she feels increasingly fatigued.   Patient is a 45 y.o. female presenting with arthritis, rash, and vomiting. The history is provided by the patient. No language interpreter was used.  Arthritis This is a chronic problem. The current episode started in the past 7 days. The problem occurs constantly. Associated symptoms include arthralgias, nausea, a rash and vomiting. Pertinent negatives include no abdominal pain, chest pain, chills, fever, numbness or weakness.  Rash Associated symptoms: joint pain, nausea and vomiting   Associated symptoms: no abdominal pain, no diarrhea, no fever and no shortness of breath   Emesis Associated symptoms: arthralgias   Associated symptoms: no abdominal pain, no chills and no diarrhea     Past Medical History  Diagnosis Date  . Arthritis   . Thyroid disease   . Hypertension   . Depression    Past Surgical History   Procedure Laterality Date  . Cholecystectomy    . Cesarean section    . Endometrial ablation     History reviewed. No pertinent family history. Social History  Substance Use Topics  . Smoking status: Current Every Day Smoker -- 1.00 packs/day  . Smokeless tobacco: None  . Alcohol Use: Yes     Comment: occasionally   OB History    No data available     Review of Systems  Constitutional: Positive for appetite change. Negative for fever, chills and unexpected weight change.  HENT: Negative.   Respiratory: Negative.  Negative for shortness of breath.   Cardiovascular: Negative.  Negative for chest pain.  Gastrointestinal: Positive for nausea and vomiting. Negative for abdominal pain and diarrhea.  Genitourinary: Negative.   Musculoskeletal: Positive for arthralgias and arthritis.  Skin: Positive for rash.  Neurological: Negative.  Negative for weakness and numbness.      Allergies  Penicillins  Home Medications   Prior to Admission medications   Medication Sig Start Date End Date Taking? Authorizing Provider  ALPRAZolam Prudy Feeler) 0.5 MG tablet Take 0.5 mg by mouth 2 (two) times daily.   Yes Historical Provider, MD  benazepril (LOTENSIN) 20 MG tablet Take 20 mg by mouth daily.   Yes Historical Provider, MD  HYDROcodone-acetaminophen (NORCO) 7.5-325 MG tablet Take 1 tablet by mouth every 6 (six) hours as needed for moderate pain.   Yes Historical Provider, MD  ibuprofen (ADVIL,MOTRIN) 200 MG tablet Take 800 mg by mouth every 6 (six) hours as needed  for moderate pain.   Yes Historical Provider, MD  levothyroxine (SYNTHROID, LEVOTHROID) 100 MCG tablet Take 100 mcg by mouth daily before breakfast.   Yes Historical Provider, MD  venlafaxine XR (EFFEXOR-XR) 150 MG 24 hr capsule Take 150 mg by mouth 2 (two) times daily.   Yes Historical Provider, MD  methotrexate (RHEUMATREX) 2.5 MG tablet Take 20 mg by mouth every Tuesday. Caution:Chemotherapy. Protect from light.    Historical  Provider, MD  oxyCODONE-acetaminophen (PERCOCET/ROXICET) 5-325 MG per tablet Take 1 tablet by mouth every 4 (four) hours as needed for pain. 03/09/13   Zadie Rhine, MD  predniSONE (DELTASONE) 50 MG tablet One tablet PO daily for 4 days 03/09/13   Zadie Rhine, MD  sulfamethoxazole-trimethoprim (BACTRIM,SEPTRA) 400-80 MG tablet Take 2 tablets by mouth 2 (two) times daily. Reported on 06/13/2016    Historical Provider, MD   BP 110/81 mmHg  Pulse 72  Temp(Src) 97.7 F (36.5 C) (Oral)  Resp 16  Ht 5\' 4"  (1.626 m)  Wt 102.059 kg  BMI 38.60 kg/m2  SpO2 96% Physical Exam  Constitutional: She is oriented to person, place, and time. She appears well-developed and well-nourished.  HENT:  Head: Normocephalic.  Neck: Normal range of motion. Neck supple.  Cardiovascular: Normal rate and regular rhythm.   No murmur heard. Pulmonary/Chest: Effort normal and breath sounds normal. She has no wheezes. She has no rales.  Abdominal: Soft. Bowel sounds are normal. There is no tenderness. There is no rebound and no guarding.  Musculoskeletal: Normal range of motion.  Wrists bilaterally swollen with very mild MCP enlargement and valgus deformity of digits 2-5. No redness or warmth. Left wrist is moderately tender with crepitance on flexion and extension.  Neurological: She is alert and oriented to person, place, and time. Coordination normal.  Skin: Skin is warm and dry. No rash noted.  Diffuse rash consisting of singular lesions in various states of healing. No blisters, pustules, or drainage.   Psychiatric: She has a normal mood and affect.    ED Course  Procedures (including critical care time) Labs Review Labs Reviewed  COMPREHENSIVE METABOLIC PANEL - Abnormal; Notable for the following:    BUN 5 (*)    Total Bilirubin 0.2 (*)    All other components within normal limits  URINALYSIS, ROUTINE W REFLEX MICROSCOPIC (NOT AT [facility]) - Abnormal; Notable for the following:    Color, Urine AMBER (*)     Specific Gravity, Urine 1.034 (*)    Bilirubin Urine SMALL (*)    Ketones, ur 15 (*)    All other components within normal limits  LIPASE, BLOOD  CBC    Imaging Review No results found. I have personally reviewed and evaluated these images and lab results as part of my medical decision-making.   EKG Interpretation None      MDM   Final diagnoses:  None    1. Rheumatoid arthritis 2. Wrist pain 3. Nonspecific rash 4. chronic nausea  The patient presents with multiple complaints most notably with 4 days of increased wrist pain, left greater than right, and change in ability to taste foods. Discussed supportive measures and dermatology referral for persistent rash. Discussed coordination of care through PCP for other complaints that are non-acute. Labs are reassuring. No evidence acute infectious process. Feel she is stable for discharge home.      [facility], PA-C 06/13/16 06/15/16  [id number], MD 06/16/16 [phone]

## 2016-06-13 NOTE — ED Notes (Signed)
Pt has multiple complaints. Has hx of rheumatoid arthritis and having severe left wrist pain x 2 days. Having lack of appetite, fatigue, rash and itching, n/v in the mornings and headache x 4 days.

## 2016-06-13 NOTE — Discharge Instructions (Signed)
FOLLOW UP WITH YOUR DOCTOR FOR FURTHER EVALUATION AND TREATMENT OF ARTHRITIS WITH INCREASED PAIN, MORNING NAUSEA, CHANGE IN ABILITY TO TASTE, FATIGUE. YOU CAN CALL A DERMATOLOGIST OF YOUR CHOICE FOR FURTHER EVALUATION OF PERSISTENT RASH. DO NOT START TAKING YOUR METHOTREXATE UNTIL ADVISED BY EITHER OF THESE DOCTORS. RETURN TO THE EMERGENCY DEPARTMENT WITH ANY SEVERE PAIN, HIGH FEVER OR NEW CONCERN.   Rash A rash is a change in the color or texture of the skin. There are many different types of rashes. You may have other problems that accompany your rash. CAUSES   Infections.  Allergic reactions. This can include allergies to pets or foods.  Certain medicines.  Exposure to certain chemicals, soaps, or cosmetics.  Heat.  Exposure to poisonous plants.  Tumors, both cancerous and noncancerous. SYMPTOMS   Redness.  Scaly skin.  Itchy skin.  Dry or cracked skin.  Bumps.  Blisters.  Pain. DIAGNOSIS  Your caregiver may do a physical exam to determine what type of rash you have. A skin sample (biopsy) may be taken and examined under a microscope. TREATMENT  Treatment depends on the type of rash you have. Your caregiver may prescribe certain medicines. For serious conditions, you may need to see a skin doctor (dermatologist). HOME CARE INSTRUCTIONS   Avoid the substance that caused your rash.  Do not scratch your rash. This can cause infection.  You may take cool baths to help stop itching.  Only take over-the-counter or prescription medicines as directed by your caregiver.  Keep all follow-up appointments as directed by your caregiver. SEEK IMMEDIATE MEDICAL CARE IF:  You have increasing pain, swelling, or redness.  You have a fever.  You have new or severe symptoms.  You have body aches, diarrhea, or vomiting.  Your rash is not better after 3 days. MAKE SURE YOU:  Understand these instructions.  Will watch your condition.  Will get help right away if you are  not doing well or get worse.   This information is not intended to replace advice given to you by your health care provider. Make sure you discuss any questions you have with your health care provider.   Document Released: 11/28/2002 Document Revised: 12/29/2014 Document Reviewed: 04/25/2015 Elsevier Interactive Patient Education 2016 [employer] Rheumatoid Arthritis Rheumatoid arthritis is a long-term (chronic) inflammatory disease that causes pain, swelling, and stiffness of the joints. It can affect the entire body, including the eyes and lungs. The effects of rheumatoid arthritis vary widely among those with the condition. CAUSES The cause of rheumatoid arthritis is not known. It tends to run in families and is more common in women. Certain cells of the body's natural defense system (immune system) do not work properly and begin to attack healthy joints. It primarily involves the connective tissue that lines the joints (synovial membrane). This can cause damage to the joint. SYMPTOMS  Pain, stiffness, swelling, and decreased motion of many joints, especially in the hands and feet.  Stiffness that is worse in the morning. It may last 1-2 hours or longer.  Numbness and tingling in the hands.  Fatigue.  Loss of appetite.  Weight loss.  Low-grade fever.  Dry eyes and mouth.  Firm lumps (rheumatoid nodules) that grow beneath the skin in areas such as the elbows and hands. DIAGNOSIS Diagnosis is based on the symptoms described, an exam, and blood tests. Sometimes, X-rays are helpful. TREATMENT The goals of treatment are to relieve pain, reduce inflammation, and to slow down or stop joint damage and  disability. Methods vary and may include:  Maintaining a balance of rest, exercise, and proper nutrition.  Your health care provider may adjust your medicines every 3 months until treatment goals are reached. Common medicines include:  Pain relievers (analgesics).  Corticosteroids  and nonsteroidal anti-inflammatory drugs (NSAIDs) to reduce inflammation.  Disease-modifying antirheumatic drugs (DMARDs) to try to slow the course of the disease.  Biologic response modifiers to reduce inflammation and damage.  Physical therapy and occupational therapy.  Surgery for patients with severe joint damage. Joint replacement or fusing of joints may be needed.  Routine monitoring and ongoing care, such as office visits, blood and urine tests, and X-rays. Your health care provider will work with you to identify the best treatment option for you, based on an assessment of the overall disease activity in your body. HOME CARE INSTRUCTIONS  Remain physically active and reduce activity when the disease gets worse.  Eat a well-balanced diet.  Put heat on affected joints when you wake up and before activities. Keep the heat on the affected joint for as long as directed by your health care provider.  Put ice on affected joints following activities or exercising.  Put ice in a plastic bag.  Place a towel between your skin and the bag.  Leave the ice on for 15-20 minutes, 3-4 times per day, or as directed by your health care provider.  Take medicines and supplements only as directed by your health care provider.  Use splints as directed by your health care provider. Splints help maintain joint position and function.  Do not sleep with pillows under your knees. This may lead to spasms.  Participate in a self-management program to keep current with the latest treatment and coping skills. SEEK IMMEDIATE MEDICAL CARE IF:  You have fainting episodes.  You have periods of extreme weakness.  You rapidly develop a hot, painful joint that is more severe than usual joint aches.  You have chills.  You have a fever. FOR MORE INFORMATION  American College of Rheumatology: www.rheumatology.org  Arthritis Foundation: www.arthritis.org   This information is not intended to replace  advice given to you by your health care provider. Make sure you discuss any questions you have with your health care provider.   Document Released: 12/05/2000 Document Revised: 12/29/2014 Document Reviewed: 01/14/2012 Elsevier Interactive Patient Education [employer]

## 2016-10-06 ENCOUNTER — Encounter (HOSPITAL_COMMUNITY): Payer: Self-pay | Admitting: Emergency Medicine

## 2016-10-06 ENCOUNTER — Emergency Department (HOSPITAL_COMMUNITY)
Admission: EM | Admit: 2016-10-06 | Discharge: 2016-10-06 | Disposition: A | Discharge status: Home / Self Care | Payer: Medicaid Other | Attending: Emergency Medicine | Admitting: Emergency Medicine

## 2016-10-06 DIAGNOSIS — Z79899 Other long term (current) drug therapy: Secondary | ICD-10-CM | POA: Insufficient documentation

## 2016-10-06 DIAGNOSIS — F17210 Nicotine dependence, cigarettes, uncomplicated: Secondary | ICD-10-CM | POA: Insufficient documentation

## 2016-10-06 DIAGNOSIS — I10 Essential (primary) hypertension: Secondary | ICD-10-CM | POA: Insufficient documentation

## 2016-10-06 DIAGNOSIS — L03211 Cellulitis of face: Secondary | ICD-10-CM | POA: Insufficient documentation

## 2016-10-06 DIAGNOSIS — Z791 Long term (current) use of non-steroidal anti-inflammatories (NSAID): Secondary | ICD-10-CM | POA: Insufficient documentation

## 2016-10-06 LAB — CBC WITH DIFFERENTIAL/PLATELET
Basophils Absolute: 0.1 10*3/uL (ref 0.0–0.1)
Basophils Relative: 1 %
Eosinophils Absolute: 0.2 10*3/uL (ref 0.0–0.7)
Eosinophils Relative: 2 %
HEMATOCRIT: 40.1 % (ref 36.0–46.0)
HEMOGLOBIN: 13.3 g/dL (ref 12.0–15.0)
LYMPHS ABS: 2.4 10*3/uL (ref 0.7–4.0)
Lymphocytes Relative: 26 %
MCH: 31 pg (ref 26.0–34.0)
MCHC: 33.2 g/dL (ref 30.0–36.0)
MCV: 93.5 fL (ref 78.0–100.0)
MONOS PCT: 8 %
Monocytes Absolute: 0.8 10*3/uL (ref 0.1–1.0)
NEUTROS ABS: 5.7 10*3/uL (ref 1.7–7.7)
NEUTROS PCT: 63 %
Platelets: 225 10*3/uL (ref 150–400)
RBC: 4.29 MIL/uL (ref 3.87–5.11)
RDW: 14.3 % (ref 11.5–15.5)
WBC: 9.1 10*3/uL (ref 4.0–10.5)

## 2016-10-06 LAB — BASIC METABOLIC PANEL
Anion gap: 7 (ref 5–15)
BUN: 6 mg/dL (ref 6–20)
CHLORIDE: 104 mmol/L (ref 101–111)
CO2: 24 mmol/L (ref 22–32)
CREATININE: 0.58 mg/dL (ref 0.44–1.00)
Calcium: 8.7 mg/dL — ABNORMAL LOW (ref 8.9–10.3)
GFR calc non Af Amer: >60 mL/min (ref >60)
Glucose, Bld: 94 mg/dL (ref 65–99)
POTASSIUM: 3.9 mmol/L (ref 3.5–5.1)
SODIUM: 135 mmol/L (ref 135–145)

## 2016-10-06 MED ORDER — DOXYCYCLINE HYCLATE 100 MG PO CAPS: 100.0000 mg | ORAL_CAPSULE | Freq: Two times a day (BID) | ORAL | 0 refills | Status: DC

## 2016-10-06 MED ORDER — DOXYCYCLINE HYCLATE 100 MG PO TABS
100.0000 mg | ORAL_TABLET | Freq: Once | ORAL | Status: AC
  Administered 2016-10-06: 100 mg via ORAL
  Filled 2016-10-06: qty 1

## 2016-10-06 NOTE — ED Triage Notes (Signed)
Pt reports abscess to her L facial area that started 5 days ago. Edema and redness noted. Pt reports pain into her lower jaw and forehead on the L.

## 2016-10-06 NOTE — ED Provider Notes (Signed)
[facility]-EMERGENCY DEPT Provider Note   CSN: [id number] Arrival date & time: 10/06/16  1651  By signing my name below, I, Vista Mink, attest that this documentation has been prepared under the direction and in the presence of Zadie Rhine, MD. Electronically signed, Vista Mink, ED Scribe. 10/06/16. 11:16 PM.  History   Chief Complaint Chief Complaint  Patient presents with  . Abscess    facial    HPI  The history is provided by the patient. No language interpreter was used.  Abscess  Location:  Face Facial abscess location:  L cheek Abscess quality: draining, painful, redness and warmth   Red streaking: yes   Duration:  5 days Progression:  Unchanged Pain details:    Quality:  Hot   Severity:  Moderate   Duration:  5 days   Timing:  Constant Chronicity:  New Context: not diabetes and not immunosuppression   Relieved by:  None tried Associated symptoms: headaches   Associated symptoms: no fatigue, no fever, no nausea and no vomiting     Past Medical History:  Diagnosis Date  . Arthritis   . Depression   . Hypertension   . Thyroid disease     There are no active problems to display for this patient.   Past Surgical History:  Procedure Laterality Date  . CESAREAN SECTION    . CHOLECYSTECTOMY    . ENDOMETRIAL ABLATION    . TUBAL LIGATION      OB History    Gravida Para Term Preterm AB Living   3 3 3          SAB TAB Ectopic Multiple Live Births                   Home Medications    Prior to Admission medications   Medication Sig Start Date End Date Taking? Authorizing Provider  ALPRAZolam ) 0.5 MG tablet Take 0.5 mg by mouth 2 (two) times daily.   Yes Historical Provider, MD  benazepril (LOTENSIN) 20 MG tablet Take 20 mg by mouth daily.   Yes Historical Provider, MD  HYDROcodone-acetaminophen (NORCO) 7.5-325 MG tablet Take 1 tablet by mouth every 6 (six) hours as needed for moderate pain.   Yes Historical Provider, MD  ibuprofen  (ADVIL,MOTRIN) 200 MG tablet Take 800 mg by mouth every 6 (six) hours as needed for moderate pain.   Yes Historical Provider, MD  levothyroxine (SYNTHROID, LEVOTHROID) 100 MCG tablet Take 100 mcg by mouth daily before breakfast.   Yes Historical Provider, MD  methotrexate (RHEUMATREX) 2.5 MG tablet Take 20 mg by mouth every Tuesday. Caution:Chemotherapy. Protect from light.   Yes Historical Provider, MD  venlafaxine XR (EFFEXOR-XR) 150 MG 24 hr capsule Take 150 mg by mouth 2 (two) times daily.   Yes Historical Provider, MD    Family History Family History  Problem Relation Age of Onset  . Diabetes Mother   . Atrial fibrillation Mother   . Pulmonary Hypertension Mother   . COPD Mother   . Diabetes Father   . Atrial fibrillation Father     Social History Social History  Substance Use Topics  . Smoking status: Current Every Day Smoker    Packs/day: 1.00    Types: Cigarettes  . Smokeless tobacco: Never Used  . Alcohol use Yes     Comment: occasionally     Allergies   Penicillins   Review of Systems Review of Systems  Constitutional: Negative for fatigue and fever.  Eyes: Negative for visual disturbance.  Gastrointestinal: Negative for abdominal pain, nausea and vomiting.  Musculoskeletal: Negative for back pain.  Skin: Positive for color change (erythema).  Neurological: Positive for headaches.  All other systems reviewed and are negative.    Physical Exam Updated Vital Signs BP 110/72 (BP Location: Right Arm)   Pulse 82   Temp 98 F (36.7 C) (Oral)   Resp 16   Ht 5\' 5"  (1.651 m)   Wt 220 lb (99.8 kg)   SpO2 98%   BMI 36.61 kg/m   Physical Exam CONSTITUTIONAL: Well developed/well nourished HEAD: Normocephalic/atraumatic EYES: EOMI/PERRL. No proptosis ENMT: Mucous membranes moist. No trismus. No tongue swelling. Bilateral nares clear and intact. See photo. NECK: supple no meningeal signs CV: S1/S2 noted, no murmurs/rubs/gallops noted LUNGS: Lungs are clear  to auscultation bilaterally, no apparent distress ABDOMEN: soft, nontender, no rebound or guarding, bowel sounds noted throughout abdomen GU:no cva tenderness NEURO: Pt is awake/alert/appropriate, moves all extremitiesx4.  No facial droop.   EXTREMITIES: pulses normal/equal, full ROM SKIN: warm, color normal, no fluctuance.  No discharge noted from wound.  No crepitus PSYCH: no abnormalities of mood noted, alert and oriented to situation    Patient gave verbal permission to utilize photo for medical documentation only The image was not stored on any personal device  ED Treatments / Results  DIAGNOSTIC STUDIES: Oxygen Saturation is 98% on RA, normal by my interpretation.  COORDINATION OF CARE: 11:13 PM-Will order oral abx and discharge. Discussed treatment plan with pt at bedside and pt agreed to plan.   Labs (all labs ordered are listed, but only abnormal results are displayed) Labs Reviewed  BASIC METABOLIC PANEL - Abnormal; Notable for the following:       Result Value   Calcium 8.7 (*)    All other components within normal limits  CBC WITH DIFFERENTIAL/PLATELET    EKG  EKG Interpretation None       Radiology No results found.  Procedures Procedures (including critical care time)  Medications Ordered in ED Medications  doxycycline (VIBRA-TABS) tablet 100 mg (100 mg Oral Given 10/06/16 2330)     Initial Impression / Assessment and Plan / ED Course  I have reviewed the triage vital signs and the nursing notes.  Pertinent labs & results that were available during my care of the patient were reviewed by me and considered in my medical decision making (see chart for details).  Clinical Course    Plan to start on oral antibiotics Wound not amenable to I&D at this time Advised warm compresses We discussed strict return precautions   Final Clinical Impressions(s) / ED Diagnoses   Final diagnoses:  Cellulitis of face    New Prescriptions Discharge  Medication List as of 10/06/2016 11:22 PM    START taking these medications   Details  doxycycline (VIBRAMYCIN) 100 MG capsule Take 1 capsule (100 mg total) by mouth 2 (two) times daily. One po bid x 7 days, Starting Mon 10/06/2016, Print      I personally performed the services described in this documentation, which was scribed in my presence. The recorded information has been reviewed and is accurate.       10/08/2016, MD 10/07/16 [phone]

## 2016-10-06 NOTE — ED Notes (Signed)
Pt alert & oriented x4, stable gait. Patient given discharge instructions, paperwork & prescription(s). Patient  instructed to stop at the registration desk to finish any additional paperwork. Patient verbalized understanding. Pt left department w/ no further questions. 

## 2017-06-11 ENCOUNTER — Other Ambulatory Visit (HOSPITAL_COMMUNITY)
Admission: RE | Admit: 2017-06-11 | Discharge: 2017-06-11 | Disposition: A | Discharge status: Home / Self Care | Payer: Medicaid Other | Source: Ambulatory Visit | Attending: Physician Assistant | Admitting: Physician Assistant

## 2017-06-11 ENCOUNTER — Ambulatory Visit: Payer: Self-pay | Admitting: Physician Assistant

## 2017-06-11 ENCOUNTER — Encounter: Payer: Self-pay | Admitting: Physician Assistant

## 2017-06-11 VITALS — BP 106/80 | HR 77 | Temp 97.7°F | Ht 64.0 in | Wt 231.5 lb

## 2017-06-11 DIAGNOSIS — Z131 Encounter for screening for diabetes mellitus: Secondary | ICD-10-CM

## 2017-06-11 DIAGNOSIS — F329 Major depressive disorder, single episode, unspecified: Secondary | ICD-10-CM

## 2017-06-11 DIAGNOSIS — E039 Hypothyroidism, unspecified: Secondary | ICD-10-CM

## 2017-06-11 DIAGNOSIS — Z8639 Personal history of other endocrine, nutritional and metabolic disease: Secondary | ICD-10-CM

## 2017-06-11 DIAGNOSIS — M069 Rheumatoid arthritis, unspecified: Secondary | ICD-10-CM

## 2017-06-11 DIAGNOSIS — F32A Depression, unspecified: Secondary | ICD-10-CM

## 2017-06-11 LAB — COMPREHENSIVE METABOLIC PANEL
ALT: 12 U/L — AB (ref 14–54)
AST: 13 U/L — ABNORMAL LOW (ref 15–41)
Albumin: 3.9 g/dL (ref 3.5–5.0)
Alkaline Phosphatase: 71 U/L (ref 38–126)
Anion gap: 9 (ref 5–15)
BILIRUBIN TOTAL: 0.3 mg/dL (ref 0.3–1.2)
BUN: 12 mg/dL (ref 6–20)
CHLORIDE: 104 mmol/L (ref 101–111)
CO2: 28 mmol/L (ref 22–32)
CREATININE: 0.73 mg/dL (ref 0.44–1.00)
Calcium: 9.2 mg/dL (ref 8.9–10.3)
GFR calc Af Amer: >60 mL/min (ref >60)
Glucose, Bld: 91 mg/dL (ref 65–99)
Potassium: 3.5 mmol/L (ref 3.5–5.1)
Sodium: 141 mmol/L (ref 135–145)
TOTAL PROTEIN: 7.1 g/dL (ref 6.5–8.1)

## 2017-06-11 LAB — TSH: TSH: 4.839 u[IU]/mL — ABNORMAL HIGH (ref 0.350–4.500)

## 2017-06-11 LAB — LIPID PANEL
CHOLESTEROL: 233 mg/dL — AB (ref 0–200)
HDL: 49 mg/dL (ref >40)
LDL CALC: 155 mg/dL — AB (ref 0–99)
TRIGLYCERIDES: 143 mg/dL (ref <150)
Total CHOL/HDL Ratio: 4.8 RATIO
VLDL: 29 mg/dL (ref 0–40)

## 2017-06-11 MED ORDER — LEVOTHYROXINE SODIUM 50 MCG PO TABS: 50.0000 ug | ORAL_TABLET | Freq: Every day | ORAL | 1 refills | Status: DC

## 2017-06-11 NOTE — Progress Notes (Signed)
BP 106/80 (BP Location: Left Arm, Patient Position: Sitting, Cuff Size: Normal)   Pulse 77   Temp 97.7 F (36.5 C)   Ht 5\' 4"  (1.626 m)   Wt 231 lb 8 oz (105 kg)   SpO2 97%   BMI 39.74 kg/m    Subjective:    Patient ID: , female    DOB: 12-27-1970, 46 y.o.   MRN: [id number]  HPI: Kathy Singh is a 46 y.o. female presenting on 06/11/2017 for New Patient (Initial Visit) (pt states her rheumatologist, Dr. 06/13/2017 told her that she was showing signs of Lupus.)   HPI   Pt previously going to Dr Lendon Colonel in [place].  She was self pay.   She says it has been several months since she saw him.    She has been off her thyroid rx about a week.  She isn't sure of her dosage.  Pharmacy was called and said it was [facility].  She is not going anywhere for counseling.  She thought about hurting herself but has no plans.  She says she is just frustrated with hurting.    Pt says she saw rheumatologist a long time ago in [place].  Pt says she has RA.  She saw dr [place].   Pt says it has been approx 6 months since she stopped her MTX.     Pt states last PAP many many years ago.   She works at [employer] but hasn't for several weeks due to RA pain  Relevant past medical, surgical, family and social history reviewed and updated as indicated. Interim medical history since our last visit reviewed. Allergies and medications reviewed and updated.   Current Outpatient Prescriptions:  .  ibuprofen (ADVIL,MOTRIN) 200 MG tablet, Take 800 mg by mouth every 6 (six) hours as needed for moderate pain., Disp: , Rfl:    Review of Systems  Constitutional: Positive for appetite change, chills, diaphoresis, fatigue and unexpected weight change. Negative for fever.  HENT: Positive for dental problem. Negative for congestion, drooling, ear pain, facial swelling, hearing loss, mouth sores, sneezing, sore throat, trouble swallowing and voice change.   Eyes: Positive for visual  disturbance. Negative for pain, discharge, redness and itching.  Respiratory: Positive for cough, shortness of breath and wheezing. Negative for choking.   Cardiovascular: Positive for palpitations. Negative for chest pain and leg swelling.  Gastrointestinal: Positive for abdominal pain, constipation, diarrhea and vomiting. Negative for blood in stool.  Endocrine: Positive for cold intolerance and heat intolerance. Negative for polydipsia.  Genitourinary: Negative for decreased urine volume, dysuria and hematuria.  Musculoskeletal: Positive for arthralgias, back pain and gait problem.  Skin: Negative for rash.  Allergic/Immunologic: Negative for environmental allergies.  Neurological: Positive for light-headedness and headaches. Negative for seizures and syncope.  Hematological: Negative for adenopathy.  Psychiatric/Behavioral: Positive for agitation, dysphoric mood and suicidal ideas. The patient is nervous/anxious.     Per HPI unless specifically indicated above     Objective:    BP 106/80 (BP Location: Left Arm, Patient Position: Sitting, Cuff Size: Normal)   Pulse 77   Temp 97.7 F (36.5 C)   Ht 5\' 4"  (1.626 m)   Wt 231 lb 8 oz (105 kg)   SpO2 97%   BMI 39.74 kg/m   Wt Readings from Last 3 Encounters:  06/11/17 231 lb 8 oz (105 kg)  10/06/16 220 lb (99.8 kg)  06/13/16 225 lb (102.1 kg)    Physical Exam  Constitutional: She  is oriented to person, place, and time. She appears well-developed and well-nourished.  HENT:  Head: Normocephalic and atraumatic.  Mouth/Throat: Oropharynx is clear and moist. No oropharyngeal exudate.  Eyes: Conjunctivae and EOM are normal. Pupils are equal, round, and reactive to light.  Neck: Neck supple. No thyromegaly present.  Cardiovascular: Normal rate and regular rhythm.   Pulmonary/Chest: Effort normal and breath sounds normal.  Abdominal: Soft. Bowel sounds are normal. She exhibits no mass. There is no hepatosplenomegaly. There is no  tenderness.  Musculoskeletal: She exhibits no edema.  Hands with RA deformity and nodules bilaterally  Lymphadenopathy:    She has no cervical adenopathy.  Neurological: She is alert and oriented to person, place, and time. Gait normal.  Skin: Skin is warm and dry.  Psychiatric: She has a normal mood and affect. Her behavior is normal.  Vitals reviewed.       Assessment & Plan:    Encounter Diagnoses  Name Primary?  . Hypothyroidism, unspecified type Yes  . Rheumatoid arthritis, involving unspecified site, unspecified rheumatoid factor presence (HCC)   . Depression, unspecified depression type   . History of hyperlipidemia   . Screening for diabetes mellitus     -Refer to [facility] for [facility] -record request sent to Dr Nick[employer] (previous rheumatologist) to get most recent office note -Get baseline labs -rx levothyroxine -urged pt to get to [facility] for MH. Told her about walk-in hours and recommended she go today or tomorrow -will plan on doing PAP in next month or two -pt Follow up 1 month.  RTO sooner prn

## 2017-06-12 LAB — HEMOGLOBIN A1C
HEMOGLOBIN A1C: 5.6 % (ref 4.8–5.6)
Mean Plasma Glucose: 114 mg/dL

## 2017-07-13 ENCOUNTER — Encounter: Payer: Self-pay | Admitting: Physician Assistant

## 2017-07-13 ENCOUNTER — Ambulatory Visit: Payer: Self-pay | Admitting: Physician Assistant

## 2017-07-13 VITALS — BP 124/84 | HR 67 | Temp 97.9°F | Ht 64.0 in | Wt 236.5 lb

## 2017-07-13 DIAGNOSIS — F329 Major depressive disorder, single episode, unspecified: Secondary | ICD-10-CM | POA: Insufficient documentation

## 2017-07-13 DIAGNOSIS — F32A Depression, unspecified: Secondary | ICD-10-CM

## 2017-07-13 DIAGNOSIS — E039 Hypothyroidism, unspecified: Secondary | ICD-10-CM

## 2017-07-13 DIAGNOSIS — M069 Rheumatoid arthritis, unspecified: Secondary | ICD-10-CM

## 2017-07-13 DIAGNOSIS — E785 Hyperlipidemia, unspecified: Secondary | ICD-10-CM

## 2017-07-13 MED ORDER — LOVASTATIN 20 MG PO TABS
20.0000 mg | ORAL_TABLET | Freq: Every day | ORAL | 4 refills | Status: DC
Start: 2017-07-13 — End: 2017-10-20

## 2017-07-13 NOTE — Progress Notes (Signed)
BP 124/84 (BP Location: Left Arm, Patient Position: Sitting, Cuff Size: Large)   Pulse 67   Temp 97.9 F (36.6 C) (Other (Comment))   Ht 5\' 4"  (1.626 m)   Wt 236 lb 8 oz (107.3 kg)   SpO2 97%   BMI 40.60 kg/m    Subjective:    Patient ID: , female    DOB: Jul 30, 1971, 46 y.o.   MRN: [id number]  HPI: Kathy Singh is a 46 y.o. female presenting on 07/13/2017 for Follow-up   HPI   Dr 07/15/2017 record request returned- said no pt on file  Pt has not gone to [facility] yet as recommended  Pt has talked with [facility] about rheumatology appointment- she is awaiting financial assistance.   Relevant past medical, surgical, family and social history reviewed and updated as indicated. Interim medical history since our last visit reviewed. Allergies and medications reviewed and updated.   Current Outpatient Prescriptions:  .  ibuprofen (ADVIL,MOTRIN) 200 MG tablet, Take 800 mg by mouth every 6 (six) hours as needed for moderate pain., Disp: , Rfl:  .  levothyroxine (SYNTHROID, LEVOTHROID) 50 MCG tablet, Take 1 tablet (50 mcg total) by mouth daily., Disp: 30 tablet, Rfl: 1   Review of Systems  Constitutional: Positive for fatigue. Negative for appetite change, chills, diaphoresis, fever and unexpected weight change.  HENT: Positive for dental problem. Negative for congestion, drooling, ear pain, facial swelling, hearing loss, mouth sores, sneezing, sore throat, trouble swallowing and voice change.   Eyes: Positive for itching. Negative for pain, discharge, redness and visual disturbance.  Respiratory: Positive for cough and shortness of breath. Negative for choking and wheezing.   Cardiovascular: Negative for chest pain, palpitations and leg swelling.  Gastrointestinal: Positive for constipation. Negative for abdominal pain, blood in stool, diarrhea and vomiting.  Endocrine: Positive for cold intolerance and heat intolerance. Negative for polydipsia.  Genitourinary: Negative  for decreased urine volume, dysuria and hematuria.  Musculoskeletal: Positive for arthralgias, back pain and gait problem.  Skin: Positive for rash.  Allergic/Immunologic: Negative for environmental allergies.  Neurological: Positive for headaches. Negative for seizures, syncope and light-headedness.  Hematological: Negative for adenopathy.  Psychiatric/Behavioral: Positive for agitation and dysphoric mood. Negative for suicidal ideas. The patient is nervous/anxious.     Per HPI unless specifically indicated above     Objective:    BP 124/84 (BP Location: Left Arm, Patient Position: Sitting, Cuff Size: Large)   Pulse 67   Temp 97.9 F (36.6 C) (Other (Comment))   Ht 5\' 4"  (1.626 m)   Wt 236 lb 8 oz (107.3 kg)   SpO2 97%   BMI 40.60 kg/m   Wt Readings from Last 3 Encounters:  07/13/17 236 lb 8 oz (107.3 kg)  06/11/17 231 lb 8 oz (105 kg)  10/06/16 220 lb (99.8 kg)    Physical Exam  Constitutional: She is oriented to person, place, and time. She appears well-developed and well-nourished.  HENT:  Head: Normocephalic and atraumatic.  Neck: Neck supple.  Cardiovascular: Normal rate and regular rhythm.   Pulmonary/Chest: Effort normal and breath sounds normal.  Abdominal: Soft. Bowel sounds are normal. She exhibits no mass. There is no hepatosplenomegaly. There is no tenderness.  Musculoskeletal: She exhibits no edema.  Lymphadenopathy:    She has no cervical adenopathy.  Neurological: She is alert and oriented to person, place, and time.  Skin: Skin is warm and dry.  Psychiatric: She has a normal mood and affect. Her behavior is normal.  Vitals reviewed.   Results for orders placed or performed during the hospital encounter of 06/11/17  Hemoglobin A1c  Result Value Ref Range   Hgb A1c MFr Bld 5.6 4.8 - 5.6 %   Mean Plasma Glucose 114 mg/dL  TSH  Result Value Ref Range   TSH 4.839 (H) 0.350 - 4.500 uIU/mL  Lipid panel  Result Value Ref Range   Cholesterol 233 (H) 0 -  200 mg/dL   Triglycerides 563 <893 mg/dL   HDL 49 >73 mg/dL   Total CHOL/HDL Ratio 4.8 RATIO   VLDL 29 0 - 40 mg/dL   LDL Cholesterol 428 (H) 0 - 99 mg/dL  Comprehensive metabolic panel  Result Value Ref Range   Sodium 141 135 - 145 mmol/L   Potassium 3.5 3.5 - 5.1 mmol/L   Chloride 104 101 - 111 mmol/L   CO2 28 22 - 32 mmol/L   Glucose, Bld 91 65 - 99 mg/dL   BUN 12 6 - 20 mg/dL   Creatinine, Ser 7.68 0.44 - 1.00 mg/dL   Calcium 9.2 8.9 - 11.5 mg/dL   Total Protein 7.1 6.5 - 8.1 g/dL   Albumin 3.9 3.5 - 5.0 g/dL   AST 13 (L) 15 - 41 U/L   ALT 12 (L) 14 - 54 U/L   Alkaline Phosphatase 71 38 - 126 U/L   Total Bilirubin 0.3 0.3 - 1.2 mg/dL   GFR calc non Af Amer >60 >60 mL/min   GFR calc Af Amer >60 >60 mL/min   Anion gap 9 5 - 15      Assessment & Plan:   Encounter Diagnoses  Name Primary?  . Hypothyroidism, unspecified type Yes  . Hyperlipidemia, unspecified hyperlipidemia type   . Rheumatoid arthritis, involving unspecified site, unspecified rheumatoid factor presence (HCC)   . Depression, unspecified depression type   . Morbid obesity (HCC)     -reviewed labs with pt -rx lovastatin and counseled on low-fat diet. Pt was given handout -pt to continue current levothyroxine -pt urged to contact [facility] for MH issues.  She has contact number and walk-in information -discussed screening mammogram.  Will start annual testing at age 89 -pt is given phone number for financial counselor at [facility] to call and check so she can get scheduled with rheumatology -pt will follow up in 3 months.  We will do PAP at that appointment.  She will RTO sooner prn

## 2017-07-13 NOTE — Patient Instructions (Addendum)
[facility] Financial [phone]     Fat and Cholesterol Restricted Diet High levels of fat and cholesterol in your blood may lead to various health problems, such as diseases of the heart, blood vessels, gallbladder, liver, and pancreas. Fats are concentrated sources of energy that come in various forms. Certain types of fat, including saturated fat, may be harmful in excess. Cholesterol is a substance needed by your body in small amounts. Your body makes all the cholesterol it needs. Excess cholesterol comes from the food you eat. When you have high levels of cholesterol and saturated fat in your blood, health problems can develop because the excess fat and cholesterol will gather along the walls of your blood vessels, causing them to narrow. Choosing the right foods will help you control your intake of fat and cholesterol. This will help keep the levels of these substances in your blood within normal limits and reduce your risk of disease. What is my plan? Your health care provider recommends that you:  Limit your fat intake to ______% or less of your total calories per day.  Limit the amount of cholesterol in your diet to less than _________mg per day.  Eat 20-30 grams of fiber each day.  What types of fat should I choose?  Choose healthy fats more often. Choose monounsaturated and polyunsaturated fats, such as olive and canola oil, flaxseeds, walnuts, almonds, and seeds.  Eat more omega-3 fats. Good choices include salmon, mackerel, sardines, tuna, flaxseed oil, and ground flaxseeds. Aim to eat fish at least two times a week.  Limit saturated fats. Saturated fats are primarily found in animal products, such as meats, butter, and cream. Plant sources of saturated fats include palm oil, palm kernel oil, and coconut oil.  Avoid foods with partially hydrogenated oils in them. These contain trans fats. Examples of foods that contain trans fats are stick margarine, some  tub margarines, cookies, crackers, and other baked goods. What general guidelines do I need to follow? These guidelines for healthy eating will help you control your intake of fat and cholesterol:  Check food labels carefully to identify foods with trans fats or high amounts of saturated fat.  Fill one half of your plate with vegetables and green salads.  Fill one fourth of your plate with whole grains. Look for the word "whole" as the first word in the ingredient list.  Fill one fourth of your plate with lean protein foods.  Limit fruit to two servings a day. Choose fruit instead of juice.  Eat more foods that contain fiber, such as apples, broccoli, carrots, beans, peas, and barley.  Eat more home-cooked food and less restaurant, buffet, and fast food.  Limit or avoid alcohol.  Limit foods high in starch and sugar.  Limit fried foods.  Cook foods using methods other than frying. Baking, boiling, grilling, and broiling are all great options.  Lose weight if you are overweight. Losing just 5-10% of your initial body weight can help your overall health and prevent diseases such as diabetes and heart disease.  What foods can I eat? Grains  Whole grains, such as whole wheat or whole grain breads, crackers, cereals, and pasta. Unsweetened oatmeal, bulgur, barley, quinoa, or brown rice. Corn or whole wheat flour tortillas. Vegetables  Fresh or frozen vegetables (raw, steamed, roasted, or grilled). Green salads. Fruits  All fresh, canned (in natural juice), or frozen fruits. Meats and other protein foods  Ground beef (85% or leaner), grass-fed beef, or beef trimmed  of fat. Skinless chicken or [place]. Ground chicken or [place]. Pork trimmed of fat. All fish and seafood. Eggs. Dried beans, peas, or lentils. Unsalted nuts or seeds. Unsalted canned or dry beans. Dairy  Low-fat dairy products, such as skim or 1% milk, 2% or reduced-fat cheeses, low-fat ricotta or cottage cheese, or  plain low-fat yo Fats and oils  Tub margarines without trans fats. Light or reduced-fat mayonnaise and salad dressings. Avocado. Olive, canola, sesame, or safflower oils. Natural peanut or almond butter (choose ones without added sugar and oil). The items listed above may not be a complete list of recommended foods or beverages. Contact your dietitian for more options. Foods to avoid Grains  White bread. White pasta. White rice. Cornbread. Bagels, pastries, and croissants. Crackers that contain trans fat. Vegetables  White potatoes. Corn. Creamed or fried vegetables. Vegetables in a cheese sauce. Fruits  Dried fruits. Canned fruit in light or heavy syrup. Fruit juice. Meats and other protein foods  Fatty cuts of meat. Ribs, chicken wings, bacon, sausage, bologna, salami, chitterlings, fatback, hot dogs, bratwurst, and packaged luncheon meats. Liver and organ meats. Dairy  Whole or 2% milk, cream, half-and-half, and cream cheese. Whole milk cheeses. Whole-fat or sweetened yogurt. Full-fat cheeses. Nondairy creamers and whipped toppings. Processed cheese, cheese spreads, or cheese curds. Beverages  Alcohol. Sweetened drinks (such as sodas, lemonade, and fruit drinks or punches). Fats and oils  Butter, stick margarine, lard, shortening, ghee, or bacon fat. Coconut, palm kernel, or palm oils. Sweets and desserts  Corn syrup, sugars, honey, and molasses. Candy. Jam and jelly. Syrup. Sweetened cereals. Cookies, pies, cakes, donuts, muffins, and ice cream. The items listed above may not be a complete list of foods and beverages to avoid. Contact your dietitian for more information. This information is not intended to replace advice given to you by your health care provider. Make sure you discuss any questions you have with your health care provider. Document Released: 12/08/2005 Document Revised: 12/29/2014 Document Reviewed: 03/08/2014 Elsevier Interactive Patient Education  2017 [employer].

## 2017-10-13 ENCOUNTER — Ambulatory Visit: Payer: Self-pay | Admitting: Physician Assistant

## 2017-10-20 ENCOUNTER — Encounter: Payer: Self-pay | Admitting: Physician Assistant

## 2017-10-20 ENCOUNTER — Ambulatory Visit: Payer: Self-pay | Admitting: Physician Assistant

## 2017-10-20 VITALS — BP 118/82 | HR 88 | Temp 98.1°F | Ht 64.0 in | Wt 243.5 lb

## 2017-10-20 DIAGNOSIS — E785 Hyperlipidemia, unspecified: Secondary | ICD-10-CM

## 2017-10-20 DIAGNOSIS — Z9119 Patient's noncompliance with other medical treatment and regimen: Secondary | ICD-10-CM

## 2017-10-20 DIAGNOSIS — E039 Hypothyroidism, unspecified: Secondary | ICD-10-CM

## 2017-10-20 DIAGNOSIS — F32A Depression, unspecified: Secondary | ICD-10-CM

## 2017-10-20 DIAGNOSIS — Z91199 Patient's noncompliance with other medical treatment and regimen due to unspecified reason: Secondary | ICD-10-CM

## 2017-10-20 DIAGNOSIS — M069 Rheumatoid arthritis, unspecified: Secondary | ICD-10-CM

## 2017-10-20 DIAGNOSIS — F329 Major depressive disorder, single episode, unspecified: Secondary | ICD-10-CM

## 2017-10-20 MED ORDER — SIMVASTATIN 20 MG PO TABS: 20.0000 mg | ORAL_TABLET | Freq: Every day | ORAL | 4 refills | Status: DC

## 2017-10-20 MED ORDER — LEVOTHYROXINE SODIUM 50 MCG PO TABS: 50.0000 ug | ORAL_TABLET | Freq: Every day | ORAL | 3 refills | Status: DC

## 2017-10-20 NOTE — Progress Notes (Signed)
BP 118/82 (BP Location: Left Arm, Patient Position: Sitting, Cuff Size: Large)   Pulse 88   Temp 98.1 F (36.7 C) (Other (Comment))   Ht 5\' 4"  (1.626 m)   Wt 243 lb 8 oz (110.5 kg)   SpO2 97%   BMI 41.80 kg/m    Subjective:    Patient ID: , female    DOB: 12-18-1971, 46 y.o.   MRN: [id number]  HPI: Kathy Singh is a 46 y.o. female presenting on 10/20/2017 for Thyroid Problem and Hyperlipidemia   HPI   Pt stopped her lovastatin because it made her leg hurt and feel numb and her levothyroxine because she heard something about recall and she was scared but she didn't call her pharmacy.  She says the leg is still numb despite stopping the rx.   Pt did not get her labs drawn.   Pt only took her meds for a month and then has been off them of 2 months.   She did not contact [facility] for MH issues.  She has contact information at home still.    Pt has not contacted financial counselor at [facility] (in order to get appt with rheumatologist)   Relevant past medical, surgical, family and social history reviewed and updated as indicated. Interim medical history since our last visit reviewed. Allergies and medications reviewed and updated.   Current Outpatient Prescriptions:  .  ibuprofen (ADVIL,MOTRIN) 200 MG tablet, Take 800 mg by mouth every 6 (six) hours as needed for moderate pain., Disp: , Rfl:  .  levothyroxine (SYNTHROID, LEVOTHROID) 50 MCG tablet, Take 1 tablet (50 mcg total) by mouth daily. (Patient not taking: Reported on 10/20/2017), Disp: 30 tablet, Rfl: 1 .  lovastatin (MEVACOR) 20 MG tablet, Take 1 tablet (20 mg total) by mouth at bedtime. (Patient not taking: Reported on 10/20/2017), Disp: 30 tablet, Rfl: 4   Review of Systems  Constitutional: Positive for appetite change and fatigue. Negative for chills, diaphoresis, fever and unexpected weight change.  HENT: Positive for dental problem, sneezing and sore throat. Negative for congestion, drooling, ear  pain, facial swelling, hearing loss, mouth sores, trouble swallowing and voice change.   Eyes: Negative for pain, discharge, redness, itching and visual disturbance.  Respiratory: Positive for cough and shortness of breath. Negative for choking and wheezing.   Cardiovascular: Positive for leg swelling. Negative for chest pain and palpitations.  Gastrointestinal: Negative for abdominal pain, blood in stool, constipation, diarrhea and vomiting.  Endocrine: Positive for cold intolerance and heat intolerance. Negative for polydipsia.  Genitourinary: Negative for decreased urine volume, dysuria and hematuria.  Musculoskeletal: Negative for arthralgias, back pain and gait problem.  Skin: Negative for rash.  Allergic/Immunologic: Negative for environmental allergies.  Neurological: Positive for headaches. Negative for seizures, syncope and light-headedness.  Hematological: Negative for adenopathy.  Psychiatric/Behavioral: Positive for dysphoric mood. Negative for agitation and suicidal ideas. The patient is nervous/anxious.     Per HPI unless specifically indicated above     Objective:    BP 118/82 (BP Location: Left Arm, Patient Position: Sitting, Cuff Size: Large)   Pulse 88   Temp 98.1 F (36.7 C) (Other (Comment))   Ht 5\' 4"  (1.626 m)   Wt 243 lb 8 oz (110.5 kg)   SpO2 97%   BMI 41.80 kg/m   Wt Readings from Last 3 Encounters:  10/20/17 243 lb 8 oz (110.5 kg)  07/13/17 236 lb 8 oz (107.3 kg)  06/11/17 231 lb 8 oz (105 kg)  Physical Exam  Constitutional: She is oriented to person, place, and time. She appears well-developed and well-nourished.  HENT:  Head: Normocephalic and atraumatic.  Neck: Neck supple.  Cardiovascular: Normal rate and regular rhythm.   Pulmonary/Chest: Effort normal and breath sounds normal.  Abdominal: Soft. Bowel sounds are normal. She exhibits no mass. There is no hepatosplenomegaly. There is no tenderness.  Musculoskeletal: She exhibits no edema.   Lymphadenopathy:    She has no cervical adenopathy.  Neurological: She is alert and oriented to person, place, and time.  Skin: Skin is warm and dry.  Psychiatric: She has a normal mood and affect. Her behavior is normal.  Vitals reviewed.       Assessment & Plan:   Encounter Diagnoses  Name Primary?  . Hypothyroidism, unspecified type Yes  . Hyperlipidemia, unspecified hyperlipidemia type   . Rheumatoid arthritis, involving unspecified site, unspecified rheumatoid factor presence (HCC)   . Depression, unspecified depression type   . Morbid obesity (HCC)   . Personal history of noncompliance with medical treatment, presenting hazards to health       -No need for labs at this time- will get those in 3 months since we are changing her meds -gave pt [place] information on mutliple myeloma (her mom has it and she doesn't understand it) -rx simvastatin for lipids and pt counseled on lowfat diet -pt to get back on her levothyroxine -pt to contact [facility] for MH issues -pt given contact information and urged to contact financial counselor at [facility] so she can get appt with rheumatologist -pt to follow up in 1 month for PAP.  RTO sooner prn

## 2017-10-20 NOTE — Patient Instructions (Addendum)
Veterinary surgeon at [facility]  -contact [facility] for anxiety/depression  -simvastatin (cholesterol) and levothyroxine (thyroid)  ------------------------------------------------------------------------------------------  Multiple Myeloma Multiple myeloma is a form of cancer that results from the uncontrolled growth of abnormal plasma cells. Plasma cells are a type of white blood cell produced in the soft tissue inside bones (bone marrow). These are cells in your blood that normally help you fight infection. They are part of your body's defense system (immune system). Plasma cells that become cancerous will grow out of control. As a result, they interfere with normal blood cells and many important functions that normal cells perform in your body. With multiple myeloma, the abnormal plasma cells cause multiple tumors to form. Multiple myeloma damages your bones and causes other health problems because of its effect on blood cells. The disease progresses and reduces your ability to fight off infections. What are the causes? The cause of multiple myeloma is not known. What increases the risk? Risk factors include:  Being older than 18.  Being African American.  Having a family history of the disease.  What are the signs or symptoms? Signs and symptoms of multiple myeloma may include:  Bone pain, especially in the back, ribs, and hips.  Broken bones (fractures).  Low blood counts, including reduced red blood cells (anemia), reduced white blood cells (leukopenia), and reduced platelets.  Fatigue.  Weakness.  Infections.  Bleeding, such as bleeding from the nose or gums, or increased bleeding from a scrape or cut.  High blood calcium levels.  Increased urination.  Confusion.  How is this diagnosed? Your health care provider will do a physical exam and take your medical history. Tests will be done to help confirm the diagnosis. Tests may include:  Blood  tests.  Urine tests.  X-rays.  MRI.  Bone marrow biopsy. In this test, a sample of marrow is removed from one of your bones. The sample is viewed under a microscope to check for abnormal plasma cells.  How is this treated? There is no cure for multiple myeloma. Treatment options may vary depending on how much the disease has advanced. Possible treatment options may include:  Medicines that kill cancer cells (chemotherapy).  Medicines that help prevent bone damage (bisphosphonates).  Radiation therapy. High-energy rays are used to kill cancer cells.  Surgery. This may be done to repair damage to bone.  Targeted drug therapy. These medicines block the growth and spread of cancer cells.  Immunotherapy. This is also called biologic therapy. It involves the use of medicines to strengthen the ability of your immune system to fight cancer cells.  Stem cell transplant. Healthy stem cells are infused into your body. These stem cells produce new blood cells to replace those killed by the disease or by other treatments. The healthy cells that are transplanted may be your own or may come from another person.  Plasmapheresis. This is a procedure used to remove plasma cells from your blood.  Other medicines to treat problems such as infections or pain.  Follow these instructions at home:  Take medicines only as directed by your health care provider.  Drink enough fluid to keep your urine clear or pale yellow.  Eat a well-balanced diet. Work with a dietitian to make sure you are getting the nutrition you need.  Take vitamins or dietary supplements as directed by your health care provider or dietitian.  Stay active. Talk to your health care provider about what types of exercises and activities are safe for you. ?  Avoid activities that cause increased pain. ? Do not lift anything heavier than 10 lb (4.5 kg).  Consider joining a support group or seeking counseling to help you cope with the  stress of having multiple myeloma.  Keep all follow-up visits as directed by your health care provider. This is important. Contact a health care provider if:  Your pain is not controlled with medicine or is getting worse.  You have a fever.  You have swollen legs.  You have weakness or dizziness.  You have unexplained weight loss.  You have unexplained bleeding or bruising.  You have a cough or cold symptoms.  You feel depressed.  You have changes in urination or bowel movements. Get help right away if:  You have sudden severe pain, especially back pain.  You have numbness or weakness in your arms, hands, legs, or feet.  You have confusion.  You have weakness on one side of your body.  You have slurred speech.  You have trouble staying awake.  You have shortness of breath.  You have blood in your stool or urine.  You vomit or cough up blood. This information is not intended to replace advice given to you by your health care provider. Make sure you discuss any questions you have with your health care provider. Document Released: 09/02/2001 Document Revised: 05/15/2016 Document Reviewed: 07/11/2014 Elsevier Interactive Patient Education  [employer].

## 2017-11-26 ENCOUNTER — Encounter: Payer: Self-pay | Admitting: Physician Assistant

## 2017-11-26 ENCOUNTER — Ambulatory Visit: Payer: Self-pay | Admitting: Physician Assistant

## 2017-11-26 VITALS — BP 126/84 | HR 94 | Temp 98.0°F | Ht 64.0 in | Wt 254.0 lb

## 2017-11-26 DIAGNOSIS — Z9119 Patient's noncompliance with other medical treatment and regimen: Secondary | ICD-10-CM

## 2017-11-26 DIAGNOSIS — Z91199 Patient's noncompliance with other medical treatment and regimen due to unspecified reason: Secondary | ICD-10-CM

## 2017-11-26 DIAGNOSIS — F32A Depression, unspecified: Secondary | ICD-10-CM

## 2017-11-26 DIAGNOSIS — F329 Major depressive disorder, single episode, unspecified: Secondary | ICD-10-CM

## 2017-11-26 DIAGNOSIS — M069 Rheumatoid arthritis, unspecified: Secondary | ICD-10-CM

## 2017-11-26 DIAGNOSIS — F419 Anxiety disorder, unspecified: Secondary | ICD-10-CM

## 2017-11-26 DIAGNOSIS — E039 Hypothyroidism, unspecified: Secondary | ICD-10-CM

## 2017-11-26 DIAGNOSIS — R079 Chest pain, unspecified: Secondary | ICD-10-CM

## 2017-11-26 DIAGNOSIS — E785 Hyperlipidemia, unspecified: Secondary | ICD-10-CM

## 2017-11-26 NOTE — Patient Instructions (Signed)
[facility] financial counselor [phone] or [phone]

## 2017-11-26 NOTE — Progress Notes (Signed)
BP 126/84 (BP Location: Left Arm, Patient Position: Sitting, Cuff Size: Large)   Pulse 94   Temp 98 F (36.7 C) (Other (Comment))   Ht 5\' 4"  (1.626 m)   Wt 254 lb (115.2 kg)   LMP 12/28/1999 (Approximate)   SpO2 97%   BMI 43.60 kg/m    Subjective:    Patient ID: [id number], female    DOB: 12-Jul-1971, 46 y.o.   MRN: [id number]  HPI: Kathy Singh is a 46 y.o. female presenting on 11/26/2017 for Gynecologic Exam and Chest Pain (c/o having intermittent left CP the last few days, not now," feels tight and can feel heart beating, no SOB, has been under a lot of stress, "could be anxiety")   HPI   Pt did not get labs drawn  Pt did not [facility] for her anxiety as recommended at previous OV.  She is still having a lot of anxiety.  Says the person she lives with is aggravating.  And she doesn't have a way out.  She feels frustration and helplessness.  She did not contact [facility] financial counselor either (in order to get appt with rheumatologist)  She says no CP today. She had it yesterday and the day before.  She feels tight and pounding.  No pain in arm or jaw.  Also lots of back pain lately.    No increase in SOB when she has the CP .  She always has some sob due to smoking.    She says the day before yesterday was the first time she had the CP.  She is also worried because she gained some weight recently.   CP is associated with nothing.  She has tried nothing for the symptoms.  Risk factors include smoking, hyperlipidemia, family history.   Pt was seen in ER for CP in 2013 and was supposed to have work up as outpt but she never did.    Relevant past medical, surgical, family and social history reviewed and updated as indicated. Interim medical history since our last visit reviewed. Allergies and medications reviewed and updated.   Current Outpatient Medications:  .  ibuprofen (ADVIL,MOTRIN) 200 MG tablet, Take 800 mg by mouth every 6 (six) hours as needed for  moderate pain., Disp: , Rfl:  .  levothyroxine (SYNTHROID, LEVOTHROID) 50 MCG tablet, Take 1 tablet (50 mcg total) by mouth daily., Disp: 30 tablet, Rfl: 3 .  simvastatin (ZOCOR) 20 MG tablet, Take 1 tablet (20 mg total) by mouth at bedtime., Disp: 30 tablet, Rfl: 4  Review of Systems  Constitutional: Positive for appetite change, chills, diaphoresis and fatigue. Negative for fever and unexpected weight change.  HENT: Positive for dental problem and voice change. Negative for congestion, drooling, ear pain, facial swelling, hearing loss, mouth sores, sneezing, sore throat and trouble swallowing.   Eyes: Negative for pain, discharge, redness, itching and visual disturbance.  Respiratory: Positive for shortness of breath. Negative for cough, choking and wheezing.   Cardiovascular: Positive for chest pain and palpitations. Negative for leg swelling.  Gastrointestinal: Negative for abdominal pain, blood in stool, constipation, diarrhea and vomiting.  Endocrine: Positive for cold intolerance and heat intolerance. Negative for polydipsia.  Genitourinary: Negative for decreased urine volume, dysuria and hematuria.  Musculoskeletal: Positive for arthralgias, back pain and gait problem.  Skin: Negative for rash.  Allergic/Immunologic: Negative for environmental allergies.  Neurological: Positive for light-headedness. Negative for seizures, syncope and headaches.  Hematological: Negative for adenopathy.  Psychiatric/Behavioral: Positive for agitation  and dysphoric mood. Negative for suicidal ideas. The patient is nervous/anxious.     Per HPI unless specifically indicated above     Objective:    BP 126/84 (BP Location: Left Arm, Patient Position: Sitting, Cuff Size: Large)   Pulse 94   Temp 98 F (36.7 C) (Other (Comment))   Ht 5\' 4"  (1.626 m)   Wt 254 lb (115.2 kg)   LMP 12/28/1999 (Approximate)   SpO2 97%   BMI 43.60 kg/m   Wt Readings from Last 3 Encounters:  11/26/17 254 lb (115.2 kg)   10/20/17 243 lb 8 oz (110.5 kg)  07/13/17 236 lb 8 oz (107.3 kg)    Physical Exam  Constitutional: She is oriented to person, place, and time. She appears well-developed and well-nourished.  HENT:  Head: Normocephalic and atraumatic.  Neck: Neck supple.  Cardiovascular: Normal rate and regular rhythm.  Pulmonary/Chest: Effort normal and breath sounds normal.  Abdominal: Soft. Bowel sounds are normal. She exhibits no mass. There is no hepatosplenomegaly. There is no tenderness.  Musculoskeletal: She exhibits no edema.  Lymphadenopathy:    She has no cervical adenopathy.  Neurological: She is alert and oriented to person, place, and time.  Skin: Skin is warm and dry.  Psychiatric: Her speech is normal and behavior is normal. Thought content normal. Her mood appears anxious.  Vitals reviewed.    ekg is abnormal. Not sig change from ekg 03/06/2012.  No acute st-t changes.      Assessment & Plan:   Encounter Diagnoses  Name Primary?  03/08/2012 Anxiety Yes  . Depression, unspecified depression type   . Hyperlipidemia, unspecified hyperlipidemia type   . Hypothyroidism, unspecified type   . Morbid obesity (HCC)   . Personal history of noncompliance with medical treatment, presenting hazards to health   . Rheumatoid arthritis, involving unspecified site, unspecified rheumatoid factor presence (HCC)   . Chest pain, unspecified type     -had lengthy Discussion with pt about cp. Likely anxiety -strongly recommended to pt that she go to walk in tomorrow at [facility].  Gave pt contact information and what she would need to take with her.  Discussed signs that she would need to go to the ER for -pt to Get labs drawn -encouraged pt to contact financial counselor at [facility] so she can get in to see rheumatologist -pt to Follow up 1 month.  RTO sooner prn

## 2017-11-27 ENCOUNTER — Other Ambulatory Visit (HOSPITAL_COMMUNITY)
Admission: RE | Admit: 2017-11-27 | Discharge: 2017-11-27 | Disposition: A | Discharge status: Home / Self Care | Payer: Self-pay | Source: Ambulatory Visit | Attending: Physician Assistant | Admitting: Physician Assistant

## 2017-11-27 DIAGNOSIS — E039 Hypothyroidism, unspecified: Secondary | ICD-10-CM | POA: Insufficient documentation

## 2017-11-27 DIAGNOSIS — E785 Hyperlipidemia, unspecified: Secondary | ICD-10-CM | POA: Insufficient documentation

## 2017-11-27 LAB — COMPREHENSIVE METABOLIC PANEL
ALBUMIN: 3.8 g/dL (ref 3.5–5.0)
ALK PHOS: 91 U/L (ref 38–126)
ALT: 19 U/L (ref 14–54)
ANION GAP: 5 (ref 5–15)
AST: 21 U/L (ref 15–41)
BUN: 8 mg/dL (ref 6–20)
CHLORIDE: 104 mmol/L (ref 101–111)
CO2: 27 mmol/L (ref 22–32)
Calcium: 9.2 mg/dL (ref 8.9–10.3)
Creatinine, Ser: 0.59 mg/dL (ref 0.44–1.00)
GFR calc non Af Amer: >60 mL/min (ref >60)
GLUCOSE: 92 mg/dL (ref 65–99)
Potassium: 3.7 mmol/L (ref 3.5–5.1)
SODIUM: 136 mmol/L (ref 135–145)
Total Bilirubin: 0.4 mg/dL (ref 0.3–1.2)
Total Protein: 7.4 g/dL (ref 6.5–8.1)

## 2017-11-27 LAB — LIPID PANEL
Cholesterol: 215 mg/dL — ABNORMAL HIGH (ref 0–200)
HDL: 49 mg/dL (ref >40)
LDL CALC: 144 mg/dL — AB (ref 0–99)
TRIGLYCERIDES: 112 mg/dL (ref <150)
Total CHOL/HDL Ratio: 4.4 RATIO
VLDL: 22 mg/dL (ref 0–40)

## 2017-11-27 LAB — TSH: TSH: 9.736 u[IU]/mL — AB (ref 0.350–4.500)

## 2017-12-29 ENCOUNTER — Ambulatory Visit: Payer: Self-pay | Admitting: Physician Assistant

## 2018-01-05 ENCOUNTER — Encounter: Payer: Self-pay | Admitting: Physician Assistant

## 2018-01-05 ENCOUNTER — Ambulatory Visit: Payer: Self-pay | Admitting: Physician Assistant

## 2018-01-05 VITALS — BP 110/86 | HR 86 | Temp 97.5°F | Ht 64.0 in | Wt 255.2 lb

## 2018-01-05 DIAGNOSIS — M069 Rheumatoid arthritis, unspecified: Secondary | ICD-10-CM

## 2018-01-05 DIAGNOSIS — H0015 Chalazion left lower eyelid: Secondary | ICD-10-CM

## 2018-01-05 DIAGNOSIS — F419 Anxiety disorder, unspecified: Secondary | ICD-10-CM

## 2018-01-05 DIAGNOSIS — F32A Depression, unspecified: Secondary | ICD-10-CM

## 2018-01-05 DIAGNOSIS — E785 Hyperlipidemia, unspecified: Secondary | ICD-10-CM

## 2018-01-05 DIAGNOSIS — Z91199 Patient's noncompliance with other medical treatment and regimen due to unspecified reason: Secondary | ICD-10-CM

## 2018-01-05 DIAGNOSIS — F329 Major depressive disorder, single episode, unspecified: Secondary | ICD-10-CM

## 2018-01-05 DIAGNOSIS — E039 Hypothyroidism, unspecified: Secondary | ICD-10-CM

## 2018-01-05 DIAGNOSIS — F17210 Nicotine dependence, cigarettes, uncomplicated: Secondary | ICD-10-CM

## 2018-01-05 DIAGNOSIS — Z9119 Patient's noncompliance with other medical treatment and regimen: Secondary | ICD-10-CM

## 2018-01-05 MED ORDER — PREDNISONE 10 MG PO TABS: ORAL_TABLET | ORAL | 0 refills | Status: AC

## 2018-01-05 NOTE — Progress Notes (Signed)
BP 110/86 (BP Location: Left Arm, Patient Position: Sitting, Cuff Size: Large)   Pulse 86   Temp (!) 97.5 F (36.4 C)   Ht 5\' 4"  (1.626 m)   Wt 255 lb 4 oz (115.8 kg)   SpO2 96%   BMI 43.81 kg/m    Subjective:    Patient ID: , female    DOB: September 26, 1971, 47 y.o.   MRN: [id number]  HPI: Kathy Singh is a 47 y.o. female presenting on 01/05/2018 for Chest Pain; Hyperlipidemia; and Thyroid Problem   HPI   Pt went to psychologist on [address] but she didn't go to [facility].   She does not have follow up with psychcologist on [address] because she was going to get charged a lot of money.     Pt Requests steroids for RA flare.  She has still not contacted [facility] as recommended at previous appointments so that she can get appt with rheumatologist.   Referrals have been made but no appt will be scheduled until pt calls the financial counseling office.   Relevant past medical, surgical, family and social history reviewed and updated as indicated. Interim medical history since our last visit reviewed. Allergies and medications reviewed and updated.   Current Outpatient Medications:  .  ibuprofen (ADVIL,MOTRIN) 200 MG tablet, Take 800 mg by mouth every 6 (six) hours as needed for moderate pain., Disp: , Rfl:  .  levothyroxine (SYNTHROID, LEVOTHROID) 50 MCG tablet, Take 1 tablet (50 mcg total) by mouth daily., Disp: 30 tablet, Rfl: 3 .  simvastatin (ZOCOR) 20 MG tablet, Take 1 tablet (20 mg total) by mouth at bedtime., Disp: 30 tablet, Rfl: 4   Review of Systems  Constitutional: Positive for chills, diaphoresis, fatigue and unexpected weight change. Negative for appetite change and fever.  HENT: Positive for dental problem. Negative for congestion, drooling, ear pain, facial swelling, hearing loss, mouth sores, sneezing, sore throat, trouble swallowing and voice change.   Eyes: Positive for redness and itching. Negative for pain, discharge and visual  disturbance.  Respiratory: Positive for shortness of breath. Negative for cough, choking and wheezing.   Cardiovascular: Negative for chest pain, palpitations and leg swelling.  Gastrointestinal: Positive for abdominal pain, constipation and diarrhea. Negative for blood in stool and vomiting.  Endocrine: Positive for cold intolerance and heat intolerance. Negative for polydipsia.  Genitourinary: Negative for decreased urine volume, dysuria and hematuria.  Musculoskeletal: Positive for back pain and gait problem. Negative for arthralgias.  Skin: Negative for rash.  Allergic/Immunologic: Negative for environmental allergies.  Neurological: Negative for seizures, syncope, light-headedness and headaches.  Hematological: Negative for adenopathy.  Psychiatric/Behavioral: Positive for agitation and dysphoric mood. Negative for suicidal ideas. The patient is nervous/anxious.     Per HPI unless specifically indicated above     Objective:    BP 110/86 (BP Location: Left Arm, Patient Position: Sitting, Cuff Size: Large)   Pulse 86   Temp (!) 97.5 F (36.4 C)   Ht 5\' 4"  (1.626 m)   Wt 255 lb 4 oz (115.8 kg)   SpO2 96%   BMI 43.81 kg/m   Wt Readings from Last 3 Encounters:  01/05/18 255 lb 4 oz (115.8 kg)  11/26/17 254 lb (115.2 kg)  10/20/17 243 lb 8 oz (110.5 kg)    Physical Exam  Constitutional: She is oriented to person, place, and time. She appears well-developed and well-nourished.  HENT:  Head: Normocephalic and atraumatic.  Eyes: Conjunctivae and EOM are  normal. Pupils are equal, round, and reactive to light.  Small Chalazion L lower lid  Neck: Neck supple.  Cardiovascular: Normal rate and regular rhythm.  Pulmonary/Chest: Effort normal and breath sounds normal.  Abdominal: Soft. Bowel sounds are normal. She exhibits no mass. There is no hepatosplenomegaly. There is no tenderness.  Musculoskeletal: She exhibits no edema.  Lymphadenopathy:    She has no cervical adenopathy.   Neurological: She is alert and oriented to person, place, and time.  Skin: Skin is warm and dry.  Psychiatric: She has a normal mood and affect. Her behavior is normal.  Vitals reviewed.     Results for orders placed or performed during the hospital encounter of 11/27/17  TSH  Result Value Ref Range   TSH 9.736 (H) 0.350 - 4.500 uIU/mL  Comprehensive Metabolic Panel (CMET)  Result Value Ref Range   Sodium 136 135 - 145 mmol/L   Potassium 3.7 3.5 - 5.1 mmol/L   Chloride 104 101 - 111 mmol/L   CO2 27 22 - 32 mmol/L   Glucose, Bld 92 65 - 99 mg/dL   BUN 8 6 - 20 mg/dL   Creatinine, Ser 2.77 0.44 - 1.00 mg/dL   Calcium 9.2 8.9 - 41.2 mg/dL   Total Protein 7.4 6.5 - 8.1 g/dL   Albumin 3.8 3.5 - 5.0 g/dL   AST 21 15 - 41 U/L   ALT 19 14 - 54 U/L   Alkaline Phosphatase 91 38 - 126 U/L   Total Bilirubin 0.4 0.3 - 1.2 mg/dL   GFR calc non Af Amer >60 >60 mL/min   GFR calc Af Amer >60 >60 mL/min   Anion gap 5 5 - 15  Lipid panel  Result Value Ref Range   Cholesterol 215 (H) 0 - 200 mg/dL   Triglycerides 878 <676 mg/dL   HDL 49 >72 mg/dL   Total CHOL/HDL Ratio 4.4 RATIO   VLDL 22 0 - 40 mg/dL   LDL Cholesterol 094 (H) 0 - 99 mg/dL      Assessment & Plan:    Encounter Diagnoses  Name Primary?  . Personal history of noncompliance with medical treatment, presenting hazards to health Yes  . Hyperlipidemia, unspecified hyperlipidemia type   . Anxiety   . Depression, unspecified depression type   . Rheumatoid arthritis, involving unspecified site, unspecified rheumatoid factor presence (HCC)   . Hypothyroidism, unspecified type   . Morbid obesity (HCC)   . Cigarette nicotine dependence without complication   . Chalazion left lower eyelid     -reviewed labs with pt -Counseled on lowfat diet and continue curent simvastatin. Recommended regular exercise and smoking cessation to help as well -discussed thyroid levels.  Will leave levothyroxine at current level in light of pt's  anxiety. -urged pt to [facility] to [facility] for [facility] with her [facility] -urged pt to Contact financial counselor at [facility] so that she can get appt with rheumatologist -discussed consequences and side effects of prednisone use.  Will give short taper dose -counseled pt that chalazion is small.  It will hopefully resolve itself. She should RTO if it gets bigger or worsens. -counseled smoking cessation -pt to follow up 3 months.  RTO sooner prn

## 2018-01-05 NOTE — Patient Instructions (Addendum)
[facility] financial counselor [phone] or [phone]  Contact [facility]   -----------------------------------------------------------------------  Fat and Cholesterol Restricted Diet High levels of fat and cholesterol in your blood may lead to various health problems, such as diseases of the heart, blood vessels, gallbladder, liver, and pancreas. Fats are concentrated sources of energy that come in various forms. Certain types of fat, including saturated fat, may be harmful in excess. Cholesterol is a substance needed by your body in small amounts. Your body makes all the cholesterol it needs. Excess cholesterol comes from the food you eat. When you have high levels of cholesterol and saturated fat in your blood, health problems can develop because the excess fat and cholesterol will gather along the walls of your blood vessels, causing them to narrow. Choosing the right foods will help you control your intake of fat and cholesterol. This will help keep the levels of these substances in your blood within normal limits and reduce your risk of disease. What is my plan? Your health care provider recommends that you:  Limit your fat intake to ______% or less of your total calories per day.  Limit the amount of cholesterol in your diet to less than _________mg per day.  Eat 20-30 grams of fiber each day.  What types of fat should I choose?  Choose healthy fats more often. Choose monounsaturated and polyunsaturated fats, such as olive and canola oil, flaxseeds, walnuts, almonds, and seeds.  Eat more omega-3 fats. Good choices include salmon, mackerel, sardines, tuna, flaxseed oil, and ground flaxseeds. Aim to eat fish at least two times a week.  Limit saturated fats. Saturated fats are primarily found in animal products, such as meats, butter, and cream. Plant sources of saturated fats include palm oil, palm kernel oil, and coconut oil.  Avoid foods with partially hydrogenated oils in them. These  contain trans fats. Examples of foods that contain trans fats are stick margarine, some tub margarines, cookies, crackers, and other baked goods. What general guidelines do I need to follow? These guidelines for healthy eating will help you control your intake of fat and cholesterol:  Check food labels carefully to identify foods with trans fats or high amounts of saturated fat.  Fill one half of your plate with vegetables and green salads.  Fill one fourth of your plate with whole grains. Look for the word "whole" as the first word in the ingredient list.  Fill one fourth of your plate with lean protein foods.  Limit fruit to two servings a day. Choose fruit instead of juice.  Eat more foods that contain fiber, such as apples, broccoli, carrots, beans, peas, and barley.  Eat more home-cooked food and less restaurant, buffet, and fast food.  Limit or avoid alcohol.  Limit foods high in starch and sugar.  Limit fried foods.  Cook foods using methods other than frying. Baking, boiling, grilling, and broiling are all great options.  Lose weight if you are overweight. Losing just 5-10% of your initial body weight can help your overall health and prevent diseases such as diabetes and heart disease.  What foods can I eat? Grains  Whole grains, such as whole wheat or whole grain breads, crackers, cereals, and pasta. Unsweetened oatmeal, bulgur, barley, quinoa, or brown rice. Corn or whole wheat flour tortillas. Vegetables  Fresh or frozen vegetables (raw, steamed, roasted, or grilled). Green salads. Fruits  All fresh, canned (in natural juice), or frozen fruits. Meats and other protein foods  Ground beef (85% or leaner), grass-fed beef,  or beef trimmed of fat. Skinless chicken or [place]. Ground chicken or [place]. Pork trimmed of fat. All fish and seafood. Eggs. Dried beans, peas, or lentils. Unsalted nuts or seeds. Unsalted canned or dry beans. Dairy  Low-fat dairy products, such  as skim or 1% milk, 2% or reduced-fat cheeses, low-fat ricotta or cottage cheese, or plain low-fat yo Fats and oils  Tub margarines without trans fats. Light or reduced-fat mayonnaise and salad dressings. Avocado. Olive, canola, sesame, or safflower oils. Natural peanut or almond butter (choose ones without added sugar and oil). The items listed above may not be a complete list of recommended foods or beverages. Contact your dietitian for more options. Foods to avoid Grains  White bread. White pasta. White rice. Cornbread. Bagels, pastries, and croissants. Crackers that contain trans fat. Vegetables  White potatoes. Corn. Creamed or fried vegetables. Vegetables in a cheese sauce. Fruits  Dried fruits. Canned fruit in light or heavy syrup. Fruit juice. Meats and other protein foods  Fatty cuts of meat. Ribs, chicken wings, bacon, sausage, bologna, salami, chitterlings, fatback, hot dogs, bratwurst, and packaged luncheon meats. Liver and organ meats. Dairy  Whole or 2% milk, cream, half-and-half, and cream cheese. Whole milk cheeses. Whole-fat or sweetened yogurt. Full-fat cheeses. Nondairy creamers and whipped toppings. Processed cheese, cheese spreads, or cheese curds. Beverages  Alcohol. Sweetened drinks (such as sodas, lemonade, and fruit drinks or punches). Fats and oils  Butter, stick margarine, lard, shortening, ghee, or bacon fat. Coconut, palm kernel, or palm oils. Sweets and desserts  Corn syrup, sugars, honey, and molasses. Candy. Jam and jelly. Syrup. Sweetened cereals. Cookies, pies, cakes, donuts, muffins, and ice cream. The items listed above may not be a complete list of foods and beverages to avoid. Contact your dietitian for more information. This information is not intended to replace advice given to you by your health care provider. Make sure you discuss any questions you have with your health care provider. Document Released: 12/08/2005 Document Revised: 12/29/2014  Document Reviewed: 03/08/2014 Elsevier Interactive Patient Education  [employer].

## 2018-04-06 ENCOUNTER — Ambulatory Visit: Payer: Self-pay | Admitting: Physician Assistant

## 2018-04-12 ENCOUNTER — Encounter: Payer: Self-pay | Admitting: Physician Assistant

## 2019-01-12 ENCOUNTER — Ambulatory Visit (HOSPITAL_COMMUNITY): Payer: Medicaid Other | Attending: Podiatry

## 2019-01-12 ENCOUNTER — Encounter (HOSPITAL_COMMUNITY): Payer: Self-pay

## 2019-01-12 ENCOUNTER — Other Ambulatory Visit: Payer: Self-pay

## 2019-01-12 DIAGNOSIS — M25572 Pain in left ankle and joints of left foot: Secondary | ICD-10-CM | POA: Insufficient documentation

## 2019-01-12 DIAGNOSIS — R29898 Other symptoms and signs involving the musculoskeletal system: Secondary | ICD-10-CM

## 2019-01-12 DIAGNOSIS — M6281 Muscle weakness (generalized): Secondary | ICD-10-CM | POA: Diagnosis present

## 2019-01-12 DIAGNOSIS — R2689 Other abnormalities of gait and mobility: Secondary | ICD-10-CM | POA: Diagnosis present

## 2019-01-12 NOTE — Therapy (Signed)
[facility] [address] [place], [place], [address] Phone: [phone]   Fax:  [phone]  Physical Therapy Evaluation  Patient Details  Name: Kathy ReadingHeather R Singh MRN: [id number] Date of Birth: 1971/09/26 Referring Provider (PT): Maisie FusKinjal, [place], [place]   Encounter Date: 01/12/2019  PT End of Session - 01/12/19 1453    Visit Number  1    Number of Visits  12    Date for PT Re-Evaluation  02/23/19   mini reassessment 02/02/2019   Authorization Type  Medicaid -     Authorization Time Period  request for 3 more visits on 01/12/2019    Authorization - Visit Number  1    Authorization - Number of Visits  1    PT Start Time  1354    PT Stop Time  1432    PT Time Calculation (min)  38 min    Activity Tolerance  Patient tolerated treatment well;Patient limited by pain    Behavior During Therapy  [facility] for tasks assessed/performed       Past Medical History:  Diagnosis Date  . Anemia   . Anxiety   . Depression   . Hypertension   . Rheumatoid arthritis (HCC)   . Thyroid disease     Past Surgical History:  Procedure Laterality Date  . CESAREAN SECTION     x1  . CHOLECYSTECTOMY    . ENDOMETRIAL ABLATION    . TUBAL LIGATION      There were no vitals filed for this visit.   Subjective Assessment - 01/12/19 1400    Subjective  History of RA. XRays of left ankle show joint narrowing and bone spurs - one under big toe on right foot. Pain walking, standing, and even with sitting just not as bad. Pain started about a year ago for no known reason. Maybe because of walking more when sister was in hospital for extended period.     Pertinent History  RA, hypothyroid, HLD, anxiety, depression    Limitations  Sitting;Standing;Walking    How long can you sit comfortably?  depending on foot position, may need to changes positions after 15-20 minutes    How long can you stand comfortably?  not at all; any standing painful    How long can you walk  comfortably?  not at all; any walking is painful    Patient Stated Goals  want to try and find some kind of relief.    Currently in Pain?  Yes    Pain Score  7     Pain Location  Ankle    Pain Orientation  Left    Pain Descriptors / Indicators  Constant;Tightness;Aching;Cramping    Pain Type  Chronic pain    Pain Onset  More than a month ago    Pain Frequency  Constant    Aggravating Factors   standing, walking    Pain Relieving Factors  sitting down, medicine, ice, heat, epsom salts help some         [facility] PT Assessment - 01/12/19 0001      Assessment   Medical Diagnosis  M25.572 Left Ankle Pain    Referring Provider (PT)  Maisie FusKinjal, Patel, DPM    Onset Date/Surgical Date  12/22/17    Hand Dominance  Left    Next MD Visit  1 month    Prior Therapy  No      Precautions   Precautions  None      Restrictions  Weight Bearing Restrictions  No      Balance Screen   Has the patient fallen in the past 6 months  No    Has the patient had a decrease in activity level because of a fear of falling?   Yes    Is the patient reluctant to leave their home because of a fear of falling?   Yes      Prior Function   Level of Independence  Independent    Vocation  On disability    Leisure  camping, hiking      Cognition   Overall Cognitive Status  Within Functional Limits for tasks assessed      ROM / Strength   AROM / PROM / Strength  AROM;Strength      AROM   AROM Assessment Site  Ankle    Right/Left Ankle  Left      Strength   Strength Assessment Site  Hip;Knee;Ankle    Right/Left Hip  Right;Left    Right Hip Flexion  4/5    Right Hip Extension  4-/5    Right Hip ABduction  4/5    Left Hip Flexion  4/5    Left Hip Extension  4-/5    Left Hip ABduction  4-/5    Right/Left Knee  Right;Left    Right Knee Flexion  4-/5    Right Knee Extension  5/5    Left Knee Flexion  4-/5    Left Knee Extension  4+/5    Right/Left Ankle  Right;Left    Right Ankle Dorsiflexion  5/5     Right Ankle Plantar Flexion  2/5    Left Ankle Dorsiflexion  4-/5    Left Ankle Plantar Flexion  2/5      Flexibility   Soft Tissue Assessment /Muscle Length  yes   achilles and soleus of sufficient length   Obturator Internus         Ambulation/Gait   Ambulation Distance (Feet)  50 Feet    Assistive device  None    Gait Pattern  Step-through pattern;Decreased stance time - left;Decreased stride length;Decreased weight shift to left;Antalgic    Ambulation Surface  Level;Indoor                Objective measurements completed on examination: See above findings.      [facility] Adult PT Treatment/Exercise - 01/12/19 0001      Exercises   Exercises  Lumbar      Lumbar Exercises: Seated   Other Seated Lumbar Exercises  heel/toe raises x10 each      Lumbar Exercises: Supine   Bridge  10 reps             PT Education - 01/12/19 1453    Education Details  Examination findings and plan of care.     Person(s) Educated  Patient    Methods  Explanation;Demonstration;Handout    Comprehension  Verbalized understanding;Returned demonstration       PT Short Term Goals - 01/12/19 1853      PT SHORT TERM GOAL #1   Title  Patient will be independent in initial HEP and perform on a regular basis for pain control and continued strengthening for functional activity carry over.    Time  3    Period  Weeks    Status  New    Target Date  02/02/19      PT SHORT TERM GOAL #2   Title  Patient will be able to stand  on Lt LE for > or = to 5 seconds to indicate decreased pain functionally, improved balance and decreased risk for falls.    Baseline  initial = Lt LE 1 sec; Rt 10 sec    Time  3    Period  Weeks    Status  New        PT Long Term Goals - 01/12/19 1856      PT LONG TERM GOAL #1   Title  Patient will be independent in advanced HEP and perform on a regular basis for continued pain control and continued strengthening for functional activity carry over to improve  QOL.    Time  6    Period  Weeks    Status  New    Target Date  02/23/19      PT LONG TERM GOAL #2   Title  Patient will be able to stand on either LE for > or = to 15 seconds to indicate decreased pain functionally, improved balance and decreased risk for falls.    Baseline  initial - Lt 1 sec; Rt 10 sec    Time  6    Period  Weeks    Status  New      PT LONG TERM GOAL #3   Title  Patient will be able to stand for 10 minutes to have a conversation with a family member without increased complaints of pain.    Baseline  initial - not at all comfortable to stand    Time  6    Period  Weeks    Status  New      PT LONG TERM GOAL #4   Title  Patient will be able to walk for 10 minutes to be able to participate in a walking program 3 x/wk to improve QOL and activitiy level.     Baseline  initial - not at all comfortable to walk    Time  6    Period  Weeks    Status  New             Plan - 01/12/19 1456    Clinical Impression Statement  Patient is a 48 year old female presenting to outpatient physical therapy with complaints of left ankle pain in addition to her usual rheumatoid arthritis pain. The pain escalated about a year ago when a family member was in the hospital for an extended period of time and she was walking a lot more than usual. Patient exhibits decreased strength, impaired gait, decreased balance, and decreased functional abilities. Patient would benefit from physical therapy to address the aforementioned deficits    History and Personal Factors relevant to plan of care:  RA, hypothyroid, HLD, anxiety, depression    Clinical Presentation  Stable    Clinical Presentation due to:  gait, pain, MMT, SLS, functional limitations, clinical judgement    Clinical Decision Making  Low    Rehab Potential  Fair    Clinical Impairments Affecting Rehab Potential  postive - willingness to attend PT and learn exercises; negative - chronicity of pain; RA co-morbidity    PT Frequency   2x / week    PT Duration  6 weeks    PT Treatment/Interventions  Aquatic Therapy;Stair training;DME Instruction;Gait training;Functional mobility training;Therapeutic activities;Balance training;Neuromuscular re-education;Patient/family education;Manual techniques;Orthotic Fit/Training;Passive range of motion;Dry needling;Energy conservation;Joint Manipulations    PT Next Visit Plan  review goals, eval, HEP; assess ankle AROM, shoes, arch/ankle posture, jt mobs    PT Home Exercise  Plan  initial - supine bridge, seated heel/toe raises.    Consulted and Agree with Plan of Care  Patient       Patient will benefit from skilled therapeutic intervention in order to improve the following deficits and impairments:  Abnormal gait, Difficulty walking, Decreased activity tolerance, Pain, Decreased balance, Decreased strength  Visit Diagnosis: Pain in left ankle and joints of left foot  Muscle weakness (generalized)  Other abnormalities of gait and mobility  Other symptoms and signs involving the musculoskeletal system     Problem List Patient Active Problem List   Diagnosis Date Noted  . Hypothyroidism 07/13/2017  . Hyperlipidemia 07/13/2017  . Rheumatoid arthritis (HCC) 07/13/2017  . Depression 07/13/2017  . Morbid obesity (HCC) 07/13/2017    Katina DungBarbara D. Hartnett-Rands, MS, PT Per Ladoris GeneDiem PT [facility] #[id number] 01/12/2019, 7:04 PM  [facility] [address] [place], [place], [address] Phone: [phone]   Fax:  [phone]  Name: Kathy Singh MRN: [id number] Date of Birth: 06-Apr-1971

## 2019-01-12 NOTE — Patient Instructions (Signed)
Flexors, Supine Bridge    Lie supine, feet shoulder-width apart. Lift hips toward ceiling. Hold 3-5___ seconds. Repeat 10-15___ times per session. Do _1__ sessions per day.  Copyright  VHI. All rights reserved.    Heel Raise (Sitting)    Raise heels, keeping toes on floor. Repeat _10-15___ times per set. Do _1___ sets per session. Do 1____ sessions per day.  http://orth.exer.us/45   Copyright  VHI. All rights reserved.   Toe Raise (Sitting)    Raise toes, keeping heels on floor. Repeat 10-15____ times per set. Do _1___ sets per session. Do __1__ sessions per day.  http://orth.exer.us/47   Copyright  VHI. All rights reserved.

## 2019-01-17 ENCOUNTER — Ambulatory Visit: Payer: Self-pay | Admitting: Physical Therapy

## 2019-01-18 ENCOUNTER — Telehealth (HOSPITAL_COMMUNITY): Payer: Self-pay | Admitting: *Deleted

## 2019-01-18 NOTE — Telephone Encounter (Signed)
01/18/19  pt cx because she forgot that she has to pick up someone from the airport

## 2019-01-19 ENCOUNTER — Ambulatory Visit (HOSPITAL_COMMUNITY): Payer: Medicaid Other | Admitting: Physical Therapy

## 2019-01-25 ENCOUNTER — Ambulatory Visit (HOSPITAL_COMMUNITY): Payer: Medicaid Other | Attending: Podiatry

## 2019-01-25 DIAGNOSIS — R2689 Other abnormalities of gait and mobility: Secondary | ICD-10-CM | POA: Diagnosis present

## 2019-01-25 DIAGNOSIS — R29898 Other symptoms and signs involving the musculoskeletal system: Secondary | ICD-10-CM | POA: Diagnosis present

## 2019-01-25 DIAGNOSIS — M6281 Muscle weakness (generalized): Secondary | ICD-10-CM | POA: Diagnosis present

## 2019-01-25 DIAGNOSIS — M25572 Pain in left ankle and joints of left foot: Secondary | ICD-10-CM | POA: Insufficient documentation

## 2019-01-25 NOTE — Patient Instructions (Addendum)
Arch supports, not gel cushions. [employer] or [employer].   HIP: Abduction / External Rotation - Side-Lying    Lie on side with hip and knees bent. Raise top knee up, squeezing glutes. Keep ankles together. _10-15__ reps per set, _1__ sets per day.   Copyright  VHI. All rights reserved.    HIP: Abduction - Side-Lying    Lie on side, legs straight and in line with trunk. Squeeze glutes. Raise top leg up and slightly back. Point toes forward. 10-15___ reps per set, _1__ sets per day. Bend bottom leg to stabilize pelvis.  Copyright  VHI. All rights reserved.

## 2019-01-25 NOTE — Therapy (Signed)
[facility] [address] [place], [place], [address] Phone: [phone]   Fax:  [phone]  Physical Therapy Treatment  Patient Details  Name: Kathy Singh MRN: [id number] Date of Birth: January 18, 1971 Referring Provider (PT): Maisie Fus, [place]   Encounter Date: 01/25/2019  PT End of Session - 01/25/19 1442    Visit Number  2    Number of Visits  12    Date for PT Re-Evaluation  02/23/19   mini reassessment 02/02/2019   Authorization Type  Medicaid -     Authorization Time Period  request for 3 more visits on 01/12/2019    Authorization - Visit Number  1    Authorization - Number of Visits  3    PT Start Time  1351    PT Stop Time  1435    PT Time Calculation (min)  44 min    Activity Tolerance  Patient tolerated treatment well;Patient limited by pain    Behavior During Therapy  [facility] for tasks assessed/performed       Past Medical History:  Diagnosis Date  . Anemia   . Anxiety   . Depression   . Hypertension   . Rheumatoid arthritis (HCC)   . Thyroid disease     Past Surgical History:  Procedure Laterality Date  . CESAREAN SECTION     x1  . CHOLECYSTECTOMY    . ENDOMETRIAL ABLATION    . TUBAL LIGATION      There were no vitals filed for this visit.  Subjective Assessment - 01/25/19 1357    Subjective  Patient reports she has been doing her exercises and practicing them.     Pertinent History  RA, hypothyroid, HLD, anxiety, depression    Limitations  Sitting;Standing;Walking    How long can you sit comfortably?  depending on foot position, may need to changes positions after 15-20 minutes    How long can you stand comfortably?  not at all; any standing painful    How long can you walk comfortably?  not at all; any walking is painful    Patient Stated Goals  want to try and find some kind of relief.    Currently in Pain?  Yes    Pain Score  7     Pain Location  Ankle    Pain Orientation  Left;Lateral    Pain  Descriptors / Indicators  Aching    Pain Onset  More than a month ago         [facility] PT Assessment - 01/25/19 0001      Posture/Postural Control   Posture/Postural Control  Postural limitations    Posture Comments  Left > Rt hip IR w/ pronation      AROM   AROM Assessment Site  Ankle    Right/Left Ankle  Left    Left Ankle Dorsiflexion  0    Left Ankle Plantar Flexion  34    Left Ankle Inversion  20    Left Ankle Eversion  8                   [facility] Adult PT Treatment/Exercise - 01/25/19 0001      Lumbar Exercises: Stretches   Gastroc Stretch  3 reps;30 seconds    Gastroc Stretch Limitations  slantboard      Lumbar Exercises: Standing   Heel Raises  10 reps    Heel Raises Limitations  toe raises 10 reps; both w/ small towel roll  to create arch      Lumbar Exercises: Supine   Bridge  10 reps;2 seconds;Non-compliant      Lumbar Exercises: Sidelying   Clam  Both;10 reps    Hip Abduction  10 reps;Both             PT Education - 01/25/19 1441    Education Details  Reviewed eval, goals and initial HEP. Advanced HEP.     Person(s) Educated  Patient    Methods  Explanation;Demonstration;Handout    Comprehension  Verbalized understanding;Returned demonstration       PT Short Term Goals - 01/12/19 1853      PT SHORT TERM GOAL #1   Title  Patient will be independent in initial HEP and perform on a regular basis for pain control and continued strengthening for functional activity carry over.    Time  3    Period  Weeks    Status  New    Target Date  02/02/19      PT SHORT TERM GOAL #2   Title  Patient will be able to stand on Lt LE for > or = to 5 seconds to indicate decreased pain functionally, improved balance and decreased risk for falls.    Baseline  initial = Lt LE 1 sec; Rt 10 sec    Time  3    Period  Weeks    Status  New        PT Long Term Goals - 01/12/19 1856      PT LONG TERM GOAL #1   Title  Patient will be independent in advanced  HEP and perform on a regular basis for continued pain control and continued strengthening for functional activity carry over to improve QOL.    Time  6    Period  Weeks    Status  New    Target Date  02/23/19      PT LONG TERM GOAL #2   Title  Patient will be able to stand on either LE for > or = to 15 seconds to indicate decreased pain functionally, improved balance and decreased risk for falls.    Baseline  initial - Lt 1 sec; Rt 10 sec    Time  6    Period  Weeks    Status  New      PT LONG TERM GOAL #3   Title  Patient will be able to stand for 10 minutes to have a conversation with a family member without increased complaints of pain.    Baseline  initial - not at all comfortable to stand    Time  6    Period  Weeks    Status  New      PT LONG TERM GOAL #4   Title  Patient will be able to walk for 10 minutes to be able to participate in a walking program 3 x/wk to improve QOL and activitiy level.     Baseline  initial - not at all comfortable to walk    Time  6    Period  Weeks    Status  New            Plan - 01/25/19 1443    Clinical Impression Statement  Continued with established plan of care. Reviewed eval, goals and initial HEP. Assessed left ankle AROM and arch indicating decreased AROM and pronation Lt > Rt. Advanced HEP to include sidelying clams and hip abduction. Performed therapeutic exercises and performed slantboard  and heel/toe raises with small towel roll under arch to form an artificial arch during strengthening and stretching. Discussed arch supports as possible benefit and weaning in schedule to tolerance. Continue with current plan, progress as able.     Rehab Potential  Fair    Clinical Impairments Affecting Rehab Potential  positive - willingness to attend PT and learn exercises; negative - chronicity of pain; RA co-morbidity    PT Frequency  2x / week    PT Duration  6 weeks    PT Treatment/Interventions  Aquatic Therapy;Stair training;DME  Instruction;Gait training;Functional mobility training;Therapeutic activities;Balance training;Neuromuscular re-education;Patient/family education;Manual techniques;Orthotic Fit/Training;Passive range of motion;Dry needling;Energy conservation;Joint Manipulations    PT Next Visit Plan  was patient able to attain arch supports, review HEP clams and hip abd; advance ther ex for mat exercises if patient unable to tolerate weight bearing positions. LE strengthening, ankle strengthening exercises.     PT Home Exercise Plan  initial - supine bridge, seated heel/toe raises.    Consulted and Agree with Plan of Care  Patient       Patient will benefit from skilled therapeutic intervention in order to improve the following deficits and impairments:  Abnormal gait, Difficulty walking, Decreased activity tolerance, Pain, Decreased balance, Decreased strength  Visit Diagnosis: Pain in left ankle and joints of left foot  Muscle weakness (generalized)  Other abnormalities of gait and mobility  Other symptoms and signs involving the musculoskeletal system     Problem List Patient Active Problem List   Diagnosis Date Noted  . Hypothyroidism 07/13/2017  . Hyperlipidemia 07/13/2017  . Rheumatoid arthritis (HCC) 07/13/2017  . Depression 07/13/2017  . Morbid obesity (HCC) 07/13/2017    Katina DungBarbara D. Hartnett-Rands, MS, PT Per Ladoris GeneDiem PT [facility] #[id number] 01/25/2019, 2:48 PM  [facility] [address] [place], [place], [address] Phone: [phone]   Fax:  [phone]  Name: Kathy Singh MRN: [id number] Date of Birth: [birth year]

## 2019-01-27 ENCOUNTER — Ambulatory Visit (HOSPITAL_COMMUNITY): Payer: Medicaid Other

## 2019-01-27 ENCOUNTER — Telehealth (HOSPITAL_COMMUNITY): Payer: Self-pay

## 2019-01-27 NOTE — Telephone Encounter (Signed)
Pt is sick and will not be here today

## 2019-02-01 ENCOUNTER — Ambulatory Visit (HOSPITAL_COMMUNITY): Payer: Medicaid Other

## 2019-02-01 ENCOUNTER — Telehealth (HOSPITAL_COMMUNITY): Payer: Self-pay

## 2019-02-01 NOTE — Telephone Encounter (Signed)
Pt called back and discussed progress with therapy.  Pt explained no approved Medicaid authorized for today and cancelled apt., reviewed next apt date and time.    [address] LPTA; CBIS [phone]

## 2019-02-01 NOTE — Telephone Encounter (Signed)
Attempted to call at 8:18am concerning coverage for insurance for today's session.  No answer and mail box is full so unable to leave message.  [address], LPTA; CBIS [phone]

## 2019-02-02 ENCOUNTER — Telehealth (HOSPITAL_COMMUNITY): Payer: Self-pay

## 2019-02-02 NOTE — Telephone Encounter (Signed)
Pt agreed to come in on Friday 02/04/19 due to PTA out of the office on Thursday. NF 02/02/2019

## 2019-02-03 ENCOUNTER — Ambulatory Visit (HOSPITAL_COMMUNITY): Payer: Medicaid Other

## 2019-02-04 ENCOUNTER — Ambulatory Visit (HOSPITAL_COMMUNITY): Payer: Medicaid Other

## 2019-02-04 ENCOUNTER — Telehealth (HOSPITAL_COMMUNITY): Payer: Self-pay | Admitting: *Deleted

## 2019-02-04 NOTE — Telephone Encounter (Signed)
02/04/19  I spoke to patient and we cx today's visit due to visit not approved by insurance.  pt was ok with it and will be here next week

## 2019-02-08 ENCOUNTER — Telehealth (HOSPITAL_COMMUNITY): Payer: Self-pay

## 2019-02-08 ENCOUNTER — Encounter (HOSPITAL_COMMUNITY): Payer: Self-pay

## 2019-02-08 ENCOUNTER — Ambulatory Visit (HOSPITAL_COMMUNITY): Payer: Medicaid Other

## 2019-02-08 NOTE — Telephone Encounter (Signed)
No show, called and spoke to pt who stated she was confused with insurance coverage for today's apt.  Reminded next apt date and time with contact information given.   [address] LPTA; CBIS [phone]

## 2019-02-10 ENCOUNTER — Ambulatory Visit (HOSPITAL_COMMUNITY): Payer: Medicaid Other | Admitting: Physical Therapy

## 2019-02-10 DIAGNOSIS — M25572 Pain in left ankle and joints of left foot: Secondary | ICD-10-CM

## 2019-02-10 DIAGNOSIS — M6281 Muscle weakness (generalized): Secondary | ICD-10-CM

## 2019-02-10 DIAGNOSIS — R2689 Other abnormalities of gait and mobility: Secondary | ICD-10-CM

## 2019-02-10 NOTE — Therapy (Signed)
[facility] [address] [place], [place], [address] Phone: [phone]   Fax:  [phone]  Physical Therapy Treatment  Patient Details  Name: Kathy Singh MRN: [id number] Date of Birth: Nov 03, 1971 Referring Provider (PT): Maisie FusKinjal, [place], [place]   Encounter Date: 02/10/2019  PT End of Session - 02/10/19 1503    Visit Number  3    Number of Visits  12    Date for PT Re-Evaluation  02/23/19   mini reassessment 02/02/2019   Authorization Type  Medicaid -     Authorization Time Period  approval 8 units 2/17-3/15    Authorization - Visit Number  1    Authorization - Number of Visits  8    PT Start Time  1355    PT Stop Time  1500    PT Time Calculation (min)  65 min    Activity Tolerance  Patient tolerated treatment well;Patient limited by pain    Behavior During Therapy  [facility] for tasks assessed/performed       Past Medical History:  Diagnosis Date  . Anemia   . Anxiety   . Depression   . Hypertension   . Rheumatoid arthritis (HCC)   . Thyroid disease     Past Surgical History:  Procedure Laterality Date  . CESAREAN SECTION     x1  . CHOLECYSTECTOMY    . ENDOMETRIAL ABLATION    . TUBAL LIGATION      There were no vitals filed for this visit.  Subjective Assessment - 02/10/19 1508    Subjective  pt states she hurts all over today, thinks its from the pressure changes with the weather.     Currently in Pain?  Yes    Pain Score  8     Pain Location  Generalized                       [facility] Adult PT Treatment/Exercise - 02/10/19 0001      Lumbar Exercises: Stretches   Gastroc Stretch  3 reps;30 seconds    Gastroc Stretch Limitations  slantboard      Lumbar Exercises: Standing   Heel Raises  15 reps    Heel Raises Limitations  toe raises 15 reps; both w/ small towel roll to create arch    Other Standing Lumbar Exercises  BAPS level 2 rt, level 1 Lt with bil UE's 10 reps A/P and Rt/Lt 5X CW/CCW      Other Standing Lumbar Exercises  hip abduction, hip extension 10 reps each      Lumbar Exercises: Seated   Sit to Stand Limitations  bilaterally 10 reps no UE's      Lumbar Exercises: Supine   Bridge  2 seconds;Non-compliant;15 reps               PT Short Term Goals - 01/12/19 1853      PT SHORT TERM GOAL #1   Title  Patient will be independent in initial HEP and perform on a regular basis for pain control and continued strengthening for functional activity carry over.    Time  3    Period  Weeks    Status  New    Target Date  02/02/19      PT SHORT TERM GOAL #2   Title  Patient will be able to stand on Lt LE for > or = to 5 seconds to indicate decreased pain functionally, improved balance and decreased risk  for falls.    Baseline  initial = Lt LE 1 sec; Rt 10 sec    Time  3    Period  Weeks    Status  New        PT Long Term Goals - 01/12/19 1856      PT LONG TERM GOAL #1   Title  Patient will be independent in advanced HEP and perform on a regular basis for continued pain control and continued strengthening for functional activity carry over to improve QOL.    Time  6    Period  Weeks    Status  New    Target Date  02/23/19      PT LONG TERM GOAL #2   Title  Patient will be able to stand on either LE for > or = to 15 seconds to indicate decreased pain functionally, improved balance and decreased risk for falls.    Baseline  initial - Lt 1 sec; Rt 10 sec    Time  6    Period  Weeks    Status  New      PT LONG TERM GOAL #3   Title  Patient will be able to stand for 10 minutes to have a conversation with a family member without increased complaints of pain.    Baseline  initial - not at all comfortable to stand    Time  6    Period  Weeks    Status  New      PT LONG TERM GOAL #4   Title  Patient will be able to walk for 10 minutes to be able to participate in a walking program 3 x/wk to improve QOL and activitiy level.     Baseline  initial - not at all  comfortable to walk    Time  6    Period  Weeks    Status  New            Plan - 02/10/19 1504    Clinical Impression Statement  continued with established POC.  Pt able to perform all exercises wihtout discomfort, however had to reduce BAPS to level 1 on Lt due to weakness/discomfort.  Pt did not purchase an orthotic for her shoe as of today but reminded to try this.  No issues or complaints at [facility].     Rehab Potential  Fair    Clinical Impairments Affecting Rehab Potential  positive - willingness to attend PT and learn exercises; negative - chronicity of pain; RA co-morbidity    PT Frequency  2x / week    PT Duration  6 weeks    PT Treatment/Interventions  Aquatic Therapy;Stair training;DME Instruction;Gait training;Functional mobility training;Therapeutic activities;Balance training;Neuromuscular re-education;Patient/family education;Manual techniques;Orthotic Fit/Training;Passive range of motion;Dry needling;Energy conservation;Joint Manipulations    PT Next Visit Plan  follow up if patient obtained arch supports.  Advance core, LE strengthening, ankle strengthening exercises.     PT Home Exercise Plan  initial - supine bridge, seated heel/toe raises.    Consulted and Agree with Plan of Care  Patient       Patient will benefit from skilled therapeutic intervention in order to improve the following deficits and impairments:  Abnormal gait, Difficulty walking, Decreased activity tolerance, Pain, Decreased balance, Decreased strength  Visit Diagnosis: Pain in left ankle and joints of left foot  Muscle weakness (generalized)  Other abnormalities of gait and mobility     Problem List Patient Active Problem List   Diagnosis Date Noted  .  Hypothyroidism 07/13/2017  . Hyperlipidemia 07/13/2017  . Rheumatoid arthritis (HCC) 07/13/2017  . Depression 07/13/2017  . Morbid obesity (HCC) 07/13/2017   Lurena Nida, PTA/CLT [id number]  Lurena Nida 02/10/2019, 3:08  PM  [facility] [address] [place], [place], [address] Phone: [phone]   Fax:  [phone]  Name: Kathy Singh MRN: [id number] Date of Birth: July 31, 1971

## 2019-02-15 ENCOUNTER — Encounter (HOSPITAL_COMMUNITY): Payer: Self-pay

## 2019-02-15 ENCOUNTER — Ambulatory Visit (HOSPITAL_COMMUNITY): Payer: Medicaid Other

## 2019-02-15 DIAGNOSIS — R29898 Other symptoms and signs involving the musculoskeletal system: Secondary | ICD-10-CM

## 2019-02-15 DIAGNOSIS — M6281 Muscle weakness (generalized): Secondary | ICD-10-CM

## 2019-02-15 DIAGNOSIS — R2689 Other abnormalities of gait and mobility: Secondary | ICD-10-CM

## 2019-02-15 DIAGNOSIS — M25572 Pain in left ankle and joints of left foot: Secondary | ICD-10-CM

## 2019-02-15 NOTE — Therapy (Signed)
[facility] [address] [place], [place], [address] Phone: [phone]   Fax:  [phone]  Physical Therapy Treatment  Patient Details  Name: Kathy Singh MRN: [id number] Date of Birth: [birth year] Referring Provider (PT): Maisie Fus, [place]   Encounter Date: 02/15/2019  PT End of Session - 02/15/19 1547    Visit Number  4    Number of Visits  12    Date for PT Re-Evaluation  02/23/19    Authorization Type  Medicaid -     Authorization Time Period  approval 8 units 2/17-3/15    Authorization - Visit Number  2    Authorization - Number of Visits  8    PT Start Time  1540    PT Stop Time  1604    PT Time Calculation (min)  24 min    Activity Tolerance  Patient tolerated treatment well;Patient limited by pain    Behavior During Therapy  [facility] for tasks assessed/performed       Past Medical History:  Diagnosis Date  . Anemia   . Anxiety   . Depression   . Hypertension   . Rheumatoid arthritis (HCC)   . Thyroid disease     Past Surgical History:  Procedure Laterality Date  . CESAREAN SECTION     x1  . CHOLECYSTECTOMY    . ENDOMETRIAL ABLATION    . TUBAL LIGATION      There were no vitals filed for this visit.  Subjective Assessment - 02/15/19 1543    Subjective  Pt stated she has generalized achey tender pain Bil hands/wrist, Rt knee and Lt ankle.  Reports compliance iwth HEP daily.      Pertinent History  RA, hypothyroid, HLD, anxiety, depression    Patient Stated Goals  want to try and find some kind of relief.    Currently in Pain?  Yes    Pain Score  7     Pain Location  Generalized   Bil hands/wrist, Rt knee and Lt ankle   Pain Orientation  Right;Left    Pain Descriptors / Indicators  Aching;Sore    Pain Type  Chronic pain    Pain Onset  More than a month ago    Pain Frequency  Constant    Aggravating Factors   standing, walking    Pain Relieving Factors  sitting down, medicine, ice, heat, epson salts  help some                       [facility] Adult PT Treatment/Exercise - 02/15/19 0001      Lumbar Exercises: Standing   Heel Raises  15 reps    Heel Raises Limitations  toe raises 15 reps; both w/ small towel roll to create arch    Other Standing Lumbar Exercises  Tandem stance 2x 30" on foam    Other Standing Lumbar Exercises  hip abduction, hip extension 10 reps each with RTB      Lumbar Exercises: Sidelying   Clam  Both;10 reps;3 seconds    Clam Limitations  RTB               PT Short Term Goals - 01/12/19 1853      PT SHORT TERM GOAL #1   Title  Patient will be independent in initial HEP and perform on a regular basis for pain control and continued strengthening for functional activity carry over.    Time  3  Period  Weeks    Status  New    Target Date  02/02/19      PT SHORT TERM GOAL #2   Title  Patient will be able to stand on Lt LE for > or = to 5 seconds to indicate decreased pain functionally, improved balance and decreased risk for falls.    Baseline  initial = Lt LE 1 sec; Rt 10 sec    Time  3    Period  Weeks    Status  New        PT Long Term Goals - 01/12/19 1856      PT LONG TERM GOAL #1   Title  Patient will be independent in advanced HEP and perform on a regular basis for continued pain control and continued strengthening for functional activity carry over to improve QOL.    Time  6    Period  Weeks    Status  New    Target Date  02/23/19      PT LONG TERM GOAL #2   Title  Patient will be able to stand on either LE for > or = to 15 seconds to indicate decreased pain functionally, improved balance and decreased risk for falls.    Baseline  initial - Lt 1 sec; Rt 10 sec    Time  6    Period  Weeks    Status  New      PT LONG TERM GOAL #3   Title  Patient will be able to stand for 10 minutes to have a conversation with a family member without increased complaints of pain.    Baseline  initial - not at all comfortable to stand     Time  6    Period  Weeks    Status  New      PT LONG TERM GOAL #4   Title  Patient will be able to walk for 10 minutes to be able to participate in a walking program 3 x/wk to improve QOL and activitiy level.     Baseline  initial - not at all comfortable to walk    Time  6    Period  Weeks    Status  New            Plan - 02/15/19 1549    Clinical Impression Statement  Pt late for apt, unable to complete full POC.  Pt reports she has purchased orthotic for arch support, not wearing them currently.  Discussion held wiht benefits of wearing orthotic and/or supportive shoes for pain control.  This session progress hip and ankle strengthening with additional theraband resistance with clams to HEP.      Rehab Potential  Fair    Clinical Impairments Affecting Rehab Potential  positive - willingness to attend PT and learn exercises; negative - chronicity of pain; RA co-morbidity    PT Frequency  2x / week    PT Duration  6 weeks    PT Treatment/Interventions  Aquatic Therapy;Stair training;DME Instruction;Gait training;Functional mobility training;Therapeutic activities;Balance training;Neuromuscular re-education;Patient/family education;Manual techniques;Orthotic Fit/Training;Passive range of motion;Dry needling;Energy conservation;Joint Manipulations    PT Next Visit Plan  Advance core, LE strengthening, ankle strengthening exercises.     PT Home Exercise Plan  initial - supine bridge, seated heel/toe raises. 02/15/19: clam RTB       Patient will benefit from skilled therapeutic intervention in order to improve the following deficits and impairments:  Abnormal gait, Difficulty walking, Decreased activity tolerance, Pain,  Decreased balance, Decreased strength  Visit Diagnosis: Pain in left ankle and joints of left foot  Muscle weakness (generalized)  Other symptoms and signs involving the musculoskeletal system  Other abnormalities of gait and mobility     Problem  List Patient Active Problem List   Diagnosis Date Noted  . Hypothyroidism 07/13/2017  . Hyperlipidemia 07/13/2017  . Rheumatoid arthritis (HCC) 07/13/2017  . Depression 07/13/2017  . Morbid obesity (HCC) 07/13/2017   Becky Saxasey Izumi Mixon, LPTA; CBIS [phone]  Juel BurrowCockerham, Acasia Skilton Jo 02/15/2019, 5:12 PM  [facility] [address] [place], [place], [address] Phone: [phone]   Fax:  [phone]  Name: Christia ReadingHeather R Quint MRN: [id number] Date of Birth: 1971-05-26

## 2019-02-17 ENCOUNTER — Ambulatory Visit (HOSPITAL_COMMUNITY): Payer: Medicaid Other

## 2019-02-17 ENCOUNTER — Encounter (HOSPITAL_COMMUNITY): Payer: Self-pay

## 2019-02-17 DIAGNOSIS — M6281 Muscle weakness (generalized): Secondary | ICD-10-CM

## 2019-02-17 DIAGNOSIS — M25572 Pain in left ankle and joints of left foot: Secondary | ICD-10-CM | POA: Diagnosis not present

## 2019-02-17 DIAGNOSIS — R29898 Other symptoms and signs involving the musculoskeletal system: Secondary | ICD-10-CM

## 2019-02-17 DIAGNOSIS — R2689 Other abnormalities of gait and mobility: Secondary | ICD-10-CM

## 2019-02-17 NOTE — Therapy (Addendum)
[place], [place], [address] Phone: [phone]   Fax:  [phone]  Physical Therapy Treatment  Patient Details  Name: Kathy Singh MRN: [id number] Date of Birth: September 25, 1971 Referring Provider (PT): Dian Queen, [place]   Encounter Date: 02/17/2019  PT End of Session - 02/17/19 1539    Visit Number  5    Number of Visits  12    Date for PT Re-Evaluation  02/23/19    Authorization Type  Medicaid -     Authorization Time Period  approval 8 units 2/17-3/15    Authorization - Visit Number  3    Authorization - Number of Visits  8    PT Start Time  6546   pt late for apt   PT Stop Time  5035    PT Time Calculation (min)  28 min    Activity Tolerance  Patient limited by pain;Patient tolerated treatment well    Behavior During Therapy  [facility] for tasks assessed/performed       Past Medical History:  Diagnosis Date  . Anemia   . Anxiety   . Depression   . Hypertension   . Rheumatoid arthritis ([place])   . Thyroid disease     Past Surgical History:  Procedure Laterality Date  . CESAREAN SECTION     x1  . CHOLECYSTECTOMY    . ENDOMETRIAL ABLATION    . TUBAL LIGATION      There were no vitals filed for this visit.  Subjective Assessment - 02/17/19 1531    Subjective  Pt stated she continues to have generalized soreness all over.  Pain scale 6/10 Lt ankle pain.  Reports she has arch support, needs to purchase tennis shoes to fit in.      Pertinent History  RA, hypothyroid, HLD, anxiety, depression    Patient Stated Goals  want to try and find some kind of relief.    Currently in Pain?  Yes    Pain Score  6     Pain Location  Generalized   ankle pain   Pain Orientation  Left    Pain Descriptors / Indicators  Aching;Sore       [facility] Adult PT Treatment/Exercise - 02/17/19 0001      Lumbar Exercises: Standing   Heel Raises  15 reps    Heel Raises Limitations  toe raises 15 reps; both w/ small towel  roll to create arch    Other Standing Lumbar Exercises  Tandem stance 2x 30" on foam; SLS 5x each Rt 12", Lt 4"    Other Standing Lumbar Exercises  hip abduction, hip extension 15 reps each with RTB and 3" holds      Lumbar Exercises: Seated   Other Seated Lumbar Exercises  towel curl for intrinsic strengthening    Other Seated Lumbar Exercises  BAPS L3 for mobility 10x each       PT Short Term Goals - 01/12/19 1853      PT SHORT TERM GOAL #1   Title  Patient will be independent in initial HEP and perform on a regular basis for pain control and continued strengthening for functional activity carry over.    Time  3    Period  Weeks    Status  New    Target Date  02/02/19      PT SHORT TERM GOAL #2   Title  Patient will be able to stand on Lt LE for > or =  to 5 seconds to indicate decreased pain functionally, improved balance and decreased risk for falls.    Baseline  initial = Lt LE 1 sec; Rt 10 sec    Time  3    Period  Weeks    Status  New        PT Long Term Goals - 01/12/19 1856      PT LONG TERM GOAL #1   Title  Patient will be independent in advanced HEP and perform on a regular basis for continued pain control and continued strengthening for functional activity carry over to improve QOL.    Time  6    Period  Weeks    Status  New    Target Date  02/23/19      PT LONG TERM GOAL #2   Title  Patient will be able to stand on either LE for > or = to 15 seconds to indicate decreased pain functionally, improved balance and decreased risk for falls.    Baseline  initial - Lt 1 sec; Rt 10 sec    Time  6    Period  Weeks    Status  New      PT LONG TERM GOAL #3   Title  Patient will be able to stand for 10 minutes to have a conversation with a family member without increased complaints of pain.    Baseline  initial - not at all comfortable to stand    Time  6    Period  Weeks    Status  New      PT LONG TERM GOAL #4   Title  Patient will be able to walk for 10 minutes  to be able to participate in a walking program 3 x/wk to improve QOL and activitiy level.     Baseline  initial - not at all comfortable to walk    Time  6    Period  Weeks    Status  New        Plan - 02/17/19 1756    Clinical Impression Statement  Pt late for apt, unable to complete full POC.  Session focus on hip and ankle strengthening as well as ankle mobility.  Pt reports she has purchased orthotic for arch support, not wearing them currently because she does not have appropriate shoes to fit in, stated she planned on purchasing soon.  Added BAPS in seated for ankle mobility and intrinsic exercises to assist with arch support.  Reminded next apt time and date, encouraged pt to arrive on time for maximal benefits.      Rehab Potential  Fair    Clinical Impairments Affecting Rehab Potential  positive - willingness to attend PT and learn exercises; negative - chronicity of pain; RA co-morbidity    PT Frequency  2x / week    PT Duration  6 weeks    PT Treatment/Interventions  Aquatic Therapy;Stair training;DME Instruction;Gait training;Functional mobility training;Therapeutic activities;Balance training;Neuromuscular re-education;Patient/family education;Manual techniques;Orthotic Fit/Training;Passive range of motion;Dry needling;Energy conservation;Joint Manipulations    PT Next Visit Plan  Advance core, LE strengthening, ankle strengthening exercises.     PT Home Exercise Plan  initial - supine bridge, seated heel/toe raises. 02/15/19: clam RTB       Patient will benefit from skilled therapeutic intervention in order to improve the following deficits and impairments:  Abnormal gait, Difficulty walking, Decreased activity tolerance, Pain, Decreased balance, Decreased strength  Visit Diagnosis: Pain in left ankle and joints of left foot  Muscle weakness (generalized)  Other symptoms and signs involving the musculoskeletal system  Other abnormalities of gait and mobility   Problem  List Patient Active Problem List   Diagnosis Date Noted  . Hypothyroidism 07/13/2017  . Hyperlipidemia 07/13/2017  . Rheumatoid arthritis ([place]) 07/13/2017  . Depression 07/13/2017  . Morbid obesity ([place]) 07/13/2017   Ihor Austin, [place]; [place]  Aldona Lento 02/17/2019, 6:02 PM  PHYSICAL THERAPY DISCHARGE SUMMARY  Visits from Start of Care: 5 of 12  Current functional level related to goals / functional outcomes: Patient discharged due to COVID-19.   Remaining deficits: Patient discharged due to COVID-19.   Education / Equipment: Patient discharged due to COVID-19. Plan: Patient agrees to discharge.  Patient goals were not met. Patient is being discharged due to not returning since the last visit.  ?????    Floria Raveling. Hartnett-Rands, MS, PT [id number] 09/17/2020  [facility] [address] [place], [place], [address] Phone: [phone]   Fax:  [phone]  Name: Kathy Singh MRN: [id number] Date of Birth: September 18, 1971

## 2019-02-22 ENCOUNTER — Ambulatory Visit (HOSPITAL_COMMUNITY): Payer: Medicaid Other

## 2019-02-22 ENCOUNTER — Ambulatory Visit (HOSPITAL_COMMUNITY): Payer: Medicaid Other | Admitting: Physical Therapy

## 2019-02-22 ENCOUNTER — Telehealth (HOSPITAL_COMMUNITY): Payer: Self-pay | Admitting: *Deleted

## 2019-02-22 NOTE — Telephone Encounter (Signed)
02/22/19  pt cx because she is sick

## 2019-02-24 ENCOUNTER — Encounter (HOSPITAL_COMMUNITY): Payer: Medicaid Other | Admitting: Physical Therapy

## 2019-02-24 ENCOUNTER — Ambulatory Visit (HOSPITAL_COMMUNITY): Payer: Medicaid Other | Admitting: Physical Therapy

## 2019-03-01 ENCOUNTER — Telehealth (HOSPITAL_COMMUNITY): Payer: Self-pay | Admitting: *Deleted

## 2019-03-01 ENCOUNTER — Ambulatory Visit (HOSPITAL_COMMUNITY): Payer: Medicaid Other | Admitting: Physical Therapy

## 2019-03-01 NOTE — Telephone Encounter (Signed)
03/01/19  pt left a message to cx said that she was still sick

## 2019-03-03 ENCOUNTER — Encounter (HOSPITAL_COMMUNITY): Payer: Medicaid Other | Admitting: Physical Therapy

## 2022-06-02 ENCOUNTER — Other Ambulatory Visit: Payer: Self-pay

## 2022-06-02 ENCOUNTER — Emergency Department (HOSPITAL_COMMUNITY): Payer: Medicare Other

## 2022-06-02 ENCOUNTER — Encounter (HOSPITAL_COMMUNITY): Payer: Self-pay | Admitting: *Deleted

## 2022-06-02 ENCOUNTER — Emergency Department (HOSPITAL_COMMUNITY)
Admission: EM | Admit: 2022-06-02 | Discharge: 2022-06-03 | Disposition: A | Discharge status: Home / Self Care | Payer: Medicare Other | Attending: Emergency Medicine | Admitting: Emergency Medicine

## 2022-06-02 DIAGNOSIS — R7401 Elevation of levels of liver transaminase levels: Secondary | ICD-10-CM | POA: Insufficient documentation

## 2022-06-02 DIAGNOSIS — R7989 Other specified abnormal findings of blood chemistry: Secondary | ICD-10-CM

## 2022-06-02 DIAGNOSIS — I10 Essential (primary) hypertension: Secondary | ICD-10-CM | POA: Diagnosis not present

## 2022-06-02 DIAGNOSIS — R059 Cough, unspecified: Secondary | ICD-10-CM | POA: Diagnosis present

## 2022-06-02 DIAGNOSIS — U071 COVID-19: Secondary | ICD-10-CM | POA: Insufficient documentation

## 2022-06-02 DIAGNOSIS — J9801 Acute bronchospasm: Secondary | ICD-10-CM | POA: Insufficient documentation

## 2022-06-02 DIAGNOSIS — R112 Nausea with vomiting, unspecified: Secondary | ICD-10-CM

## 2022-06-02 LAB — CBC
HCT: 48.4 % — ABNORMAL HIGH (ref 36.0–46.0)
Hemoglobin: 15.6 g/dL — ABNORMAL HIGH (ref 12.0–15.0)
MCH: 28.5 pg (ref 26.0–34.0)
MCHC: 32.2 g/dL (ref 30.0–36.0)
MCV: 88.5 fL (ref 80.0–100.0)
Platelets: 132 10*3/uL — ABNORMAL LOW (ref 150–400)
RBC: 5.47 MIL/uL — ABNORMAL HIGH (ref 3.87–5.11)
RDW: 15.2 % (ref 11.5–15.5)
WBC: 5.8 10*3/uL (ref 4.0–10.5)
nRBC: 0 % (ref 0.0–0.2)

## 2022-06-02 LAB — COMPREHENSIVE METABOLIC PANEL
ALT: 193 U/L — ABNORMAL HIGH (ref 0–44)
AST: 106 U/L — ABNORMAL HIGH (ref 15–41)
Albumin: 4.3 g/dL (ref 3.5–5.0)
Alkaline Phosphatase: 137 U/L — ABNORMAL HIGH (ref 38–126)
Anion gap: 9 (ref 5–15)
BUN: 14 mg/dL (ref 6–20)
CO2: 23 mmol/L (ref 22–32)
Calcium: 9 mg/dL (ref 8.9–10.3)
Chloride: 105 mmol/L (ref 98–111)
Creatinine, Ser: 0.71 mg/dL (ref 0.44–1.00)
GFR, Estimated: >60 mL/min (ref >60)
Glucose, Bld: 114 mg/dL — ABNORMAL HIGH (ref 70–99)
Potassium: 3.5 mmol/L (ref 3.5–5.1)
Sodium: 137 mmol/L (ref 135–145)
Total Bilirubin: 0.8 mg/dL (ref 0.3–1.2)
Total Protein: 8.4 g/dL — ABNORMAL HIGH (ref 6.5–8.1)

## 2022-06-02 LAB — URINALYSIS, ROUTINE W REFLEX MICROSCOPIC
Bacteria, UA: NONE SEEN
Bilirubin Urine: NEGATIVE
Glucose, UA: NEGATIVE mg/dL
Ketones, ur: 80 mg/dL — AB
Leukocytes,Ua: NEGATIVE
Nitrite: NEGATIVE
Protein, ur: 100 mg/dL — AB
Specific Gravity, Urine: 1.024 (ref 1.005–1.030)
pH: 6 (ref 5.0–8.0)

## 2022-06-02 LAB — SARS CORONAVIRUS 2 BY RT PCR: SARS Coronavirus 2 by RT PCR: POSITIVE — AB

## 2022-06-02 LAB — LIPASE, BLOOD: Lipase: 29 U/L (ref 11–51)

## 2022-06-02 MED ORDER — ONDANSETRON HCL 4 MG PO TABS: 4.0000 mg | ORAL_TABLET | Freq: Four times a day (QID) | ORAL | 0 refills | Status: AC

## 2022-06-02 MED ORDER — ACETAMINOPHEN 500 MG PO TABS
1000.0000 mg | ORAL_TABLET | Freq: Once | ORAL | Status: AC
  Administered 2022-06-02: 1000 mg via ORAL
  Filled 2022-06-02: qty 2

## 2022-06-02 MED ORDER — ALBUTEROL SULFATE HFA 108 (90 BASE) MCG/ACT IN AERS
2.0000 | INHALATION_SPRAY | Freq: Once | RESPIRATORY_TRACT | Status: AC
Start: 2022-06-02 — End: 2022-06-02
  Administered 2022-06-02: 2 via RESPIRATORY_TRACT
  Filled 2022-06-02: qty 6.7

## 2022-06-02 MED ORDER — IPRATROPIUM-ALBUTEROL 0.5-2.5 (3) MG/3ML IN SOLN
RESPIRATORY_TRACT | Status: AC
  Filled 2022-06-02: qty 3

## 2022-06-02 MED ORDER — SODIUM CHLORIDE 0.9 % IV BOLUS
1000.0000 mL | Freq: Once | INTRAVENOUS | Status: AC
  Administered 2022-06-02: 1000 mL via INTRAVENOUS

## 2022-06-02 MED ORDER — IPRATROPIUM-ALBUTEROL 0.5-2.5 (3) MG/3ML IN SOLN
3.0000 mL | Freq: Once | RESPIRATORY_TRACT | Status: AC
  Administered 2022-06-02: 3 mL via RESPIRATORY_TRACT
  Filled 2022-06-02: qty 3

## 2022-06-02 MED ORDER — BENZONATATE 200 MG PO CAPS: 200.0000 mg | ORAL_CAPSULE | Freq: Three times a day (TID) | ORAL | 0 refills | Status: AC | PRN

## 2022-06-02 MED ORDER — ONDANSETRON HCL 4 MG/2ML IJ SOLN
4.0000 mg | Freq: Once | INTRAMUSCULAR | Status: AC
  Administered 2022-06-02: 4 mg via INTRAVENOUS
  Filled 2022-06-02: qty 2

## 2022-06-02 NOTE — ED Provider Notes (Signed)
[facility] Provider Note   CSN: [id number] Arrival date & time: 06/02/22  1920     History  Chief Complaint  Patient presents with   Emesis    Kathy Singh is a 51 y.o. female.  Kathy Singh is a 51 y.o. female with a history of hypertension, rheumatoid arthritis, anxiety and depression, who presents to the emergency department for evaluation of 1 week of chills, cough, headache, vomiting and diarrhea.  Patient reports that her symptoms initially started primarily with coughing, congestion and chills, then over the past few days she has noticed that her symptoms of gotten worse and she has been having persistent diarrhea with some nausea and vomiting.  No melena, hematochezia or hematemesis.  Patient reports she has felt feverish but has not taken her temperature.  She has tried some over-the-counter medications without much improvement but over the past few days has not been able to keep much of anything down including medications.  Reports that just prior to symptoms starting she was on a trip with some other people who developed similar symptoms but their symptoms all resolved fairly quickly but hers have persisted.  The history is provided by the patient.  Emesis Associated symptoms: chills, cough, diarrhea, headaches, myalgias and sore throat   Associated symptoms: no abdominal pain and no fever        Home Medications Prior to Admission medications   Medication Sig Start Date End Date Taking? Authorizing Provider  benzonatate (TESSALON) 200 MG capsule Take 1 capsule (200 mg total) by mouth 3 (three) times daily as needed for cough. 06/02/22  Yes Jacqlyn Larsen, PA-C  buPROPion (WELLBUTRIN XL) 300 MG 24 hr tablet Take 300 mg by mouth daily. 03/28/22  Yes [provider]  clonazePAM (KLONOPIN) 0.5 MG tablet Take 0.5 mg by mouth 2 (two) times daily as needed. 05/12/22  Yes [provider]  famotidine (PEPCID) 20 MG tablet Take 20 mg by  mouth 2 (two) times daily. 05/04/22  Yes [provider]  gabapentin (NEURONTIN) 600 MG tablet Take 600 mg by mouth 2 (two) times daily. 03/28/22  Yes [provider]  hydroxychloroquine (PLAQUENIL) 200 MG tablet Take 200 mg by mouth daily. 05/12/22  Yes [provider]  ibuprofen (ADVIL,MOTRIN) 200 MG tablet Take 800 mg by mouth every 6 (six) hours as needed for moderate pain.   Yes [provider]  meloxicam (MOBIC) 15 MG tablet Take 15 mg by mouth daily. 03/28/22  Yes [provider]  ondansetron (ZOFRAN) 4 MG tablet Take 1 tablet (4 mg total) by mouth every 6 (six) hours. 06/02/22  Yes Jacqlyn Larsen, PA-C  QUEtiapine (SEROQUEL) 100 MG tablet Take 100 mg by mouth at bedtime. 12/30/21  Yes [provider]  SYNTHROID 150 MCG tablet Take 150 mcg by mouth daily. 05/13/22  Yes [provider]  predniSONE (DELTASONE) 10 MG tablet Day 1 take 6 tablets po qam. Day 2 take 5 tablets po qam. Day 3 take 4 tablets po qam. Day 4 take 3 tablets po qam. Day 5 take 2 tablets po qam. Day 6 take 1 tablet po qam. Patient not taking: Reported on 06/02/2022 01/05/18   Soyla Dryer, PA-C      Allergies    Penicillins    Review of Systems   Review of Systems  Constitutional:  Positive for chills. Negative for fever.  HENT:  Positive for congestion, rhinorrhea and sore throat.   Respiratory:  Positive for cough.  Negative for shortness of breath.   Cardiovascular:  Negative for chest pain.  Gastrointestinal:  Positive for diarrhea, nausea and vomiting. Negative for abdominal pain.  Musculoskeletal:  Positive for myalgias. Negative for neck pain and neck stiffness.  Skin:  Negative for color change and rash.  Neurological:  Positive for headaches.  All other systems reviewed and are negative.   Physical Exam Updated Vital Signs BP (!) 153/87 (BP Location: Right Arm)   Pulse 70   Temp 98.2 F (36.8 C) (Oral)   Resp 18   Ht 5\' 4"  (1.626 m)   Wt 117.9  kg   SpO2 94%   BMI 44.63 kg/m  Physical Exam Vitals and nursing note reviewed.  Constitutional:      General: She is not in acute distress.    Appearance: Normal appearance. She is well-developed. She is not ill-appearing or diaphoretic.  HENT:     Head: Normocephalic and atraumatic.     Nose: Rhinorrhea present.     Mouth/Throat:     Mouth: Mucous membranes are moist.     Pharynx: Oropharynx is clear.  Eyes:     General:        Right eye: No discharge.        Left eye: No discharge.  Neck:     Comments: No rigidity Cardiovascular:     Rate and Rhythm: Normal rate and regular rhythm.     Heart sounds: Normal heart sounds. No murmur heard.    No friction rub. No gallop.  Pulmonary:     Effort: Pulmonary effort is normal. No respiratory distress.     Breath sounds: Normal breath sounds.     Comments: Respirations equal and unlabored, patient able to speak in full sentences, lungs clear to auscultation bilaterally  Abdominal:     General: Bowel sounds are normal. There is no distension.     Palpations: Abdomen is soft. There is no mass.     Tenderness: There is no abdominal tenderness. There is no guarding.     Comments: Abdomen soft, nondistended, nontender to palpation in all quadrants without guarding or peritoneal signs  Musculoskeletal:        General: No deformity.     Cervical back: Neck supple.  Lymphadenopathy:     Cervical: No cervical adenopathy.  Skin:    General: Skin is warm and dry.     Capillary Refill: Capillary refill takes less than 2 seconds.  Neurological:     Mental Status: She is alert and oriented to person, place, and time.  Psychiatric:        Mood and Affect: Mood normal.        Behavior: Behavior normal.     ED Results / Procedures / Treatments   Labs (all labs ordered are listed, but only abnormal results are displayed) Labs Reviewed  SARS CORONAVIRUS 2 BY RT PCR - Abnormal; Notable for the following components:      Result Value    SARS Coronavirus 2 by RT PCR POSITIVE (*)    All other components within normal limits  COMPREHENSIVE METABOLIC PANEL - Abnormal; Notable for the following components:   Glucose, Bld 114 (*)    Total Protein 8.4 (*)    AST 106 (*)    ALT 193 (*)    Alkaline Phosphatase 137 (*)    All other components within normal limits  CBC - Abnormal; Notable for the following components:   RBC 5.47 (*)    Hemoglobin 15.6 (*)  HCT 48.4 (*)    Platelets 132 (*)    All other components within normal limits  URINALYSIS, ROUTINE W REFLEX MICROSCOPIC - Abnormal; Notable for the following components:   Color, Urine AMBER (*)    Hgb urine dipstick SMALL (*)    Ketones, ur 80 (*)    Protein, ur 100 (*)    All other components within normal limits  LIPASE, BLOOD  HEPATITIS PANEL, ACUTE    EKG None  Radiology DG Chest 2 View  Result Date: 06/02/2022 CLINICAL DATA:  Cough EXAM: CHEST - 2 VIEW COMPARISON:  03/06/2012 FINDINGS: The heart size and mediastinal contours are within normal limits. Both lungs are clear. The visualized skeletal structures are unremarkable. IMPRESSION: No active cardiopulmonary disease. Electronically Signed   By: Rolm Baptise M.D.   On: 06/02/2022 20:57    Procedures Procedures    Medications Ordered in ED Medications  sodium chloride 0.9 % bolus 1,000 mL (0 mLs Intravenous Stopped 06/02/22 2239)  ondansetron (ZOFRAN) injection 4 mg (4 mg Intravenous Given 06/02/22 2116)  ipratropium-albuterol (DUONEB) 0.5-2.5 (3) MG/3ML nebulizer solution 3 mL ( Nebulization Not Given 06/02/22 2147)  acetaminophen (TYLENOL) tablet 1,000 mg (1,000 mg Oral Given 06/02/22 2108)  albuterol (VENTOLIN HFA) 108 (90 Base) MCG/ACT inhaler 2 puff (2 puffs Inhalation Given 06/02/22 2243)    ED Course/ Medical Decision Making/ A&P                           Medical Decision Making Amount and/or Complexity of Data Reviewed Labs: ordered. Radiology: ordered.  Risk OTC drugs. Prescription drug  management.   51 y.o. female presents with 7 days of chiils, cough, bodyaches, N/V/D.  Fortunately patient is overall well-appearing, but does appear a bit dehydrated. Vitals WNL. Patient with no hypoxia or increased work of breathing at rest or with activity.   Some faint wheezing noted. Chest x-ray reviewed and is clear without signs of -pneumonia at this time, no other active cardiopulmonary disease noted.  Lab work ordered  ad interpreted, significant for: No leukocytosis, mildly elevated hemoglobin, likely in the setting of hemoconcentration, no significant electrolyte derangements and normal renal function, mild transaminitis present, but patient without focal right upper quadrant tenderness.  Urinalysis with ketones present consistent with concern for dehydration, normal lipase.  Hepatitis panel pending in setting of elevated liver enzymes.  COVID-positive which could also explain transaminitis  Patient treated symptomatically with IV fluid bolus, Zofran and Tylenol, given albuterol inhaler for mild wheezing with resolution.  Patient now tolerating p.o.  Discussed results with patient, positive COVID test certainly explains her symptoms.  She is significantly improved after symptomatic treatment here in the ED.  Discussed quarantine and continued symptomatic treatment, but given reassuring evaluation today and normal O2 sats, do not feel that patient will require hospital admission.  Will discharge home with medications for continued symptomatic treatment.   SACHEEN KRUMWIEDE was evaluated in Emergency Department on 06/15/2022 for the symptoms described in the history of present illness. She was evaluated in the context of the global COVID-19 pandemic, which necessitated consideration that the patient might be at risk for infection with the SARS-CoV-2 virus that causes COVID-19. Institutional protocols and algorithms that pertain to the evaluation of patients at risk for COVID-19 are in a state  of rapid change based on information released by regulatory bodies including the CDC and federal and state organizations. These policies and algorithms were followed during the patient's care  in the ED.         Final Clinical Impression(s) / ED Diagnoses Final diagnoses:  COVID-19 virus infection  Nausea vomiting and diarrhea  Bronchospasm  Elevated LFTs    Rx / DC Orders ED Discharge Orders          Ordered    ondansetron (ZOFRAN) 4 MG tablet  Every 6 hours        06/02/22 2319    benzonatate (TESSALON) 200 MG capsule  3 times daily PRN        06/02/22 2319              Jacqlyn Larsen, PA-C 06/15/22 2346    Luna Fuse, MD 06/21/22 [phone]

## 2022-06-02 NOTE — Discharge Instructions (Addendum)
You have tested positive for COVID this is likely what is causing your symptoms and it typically resolves in about 7 to 10 days.  You can use Zofran as needed for nausea.  Try and make sure you are drinking plenty of fluids and stick to bland foods over the next few days.  You may use prescribed Tessalon Perles as needed for cough and you can use inhaler as needed for coughing spasms or wheezing.  You should quarantine at home until your symptoms are improving.  If you develop worsening shortness of breath, persistent vomiting or feel as though your symptoms are worsening and not improving return for reevaluation.  Your liver function tests were slightly elevated today.  I have sent hepatitis testing you will be called if any of these test come back positive.  This is likely in the setting of your COVID infection and it should resolve on its own.  You should follow-up closely with your primary care doctor to have these rechecked.  If you develop abdominal pain return for reevaluation.

## 2022-06-02 NOTE — ED Notes (Signed)
Patient given saltine crackers and ginger ale for PO challenge. °

## 2022-06-02 NOTE — ED Triage Notes (Signed)
Pt reports headache, diarrhea, headache, coughing, chills, hot flashes since x7 days.

## 2022-06-03 LAB — HEPATITIS PANEL, ACUTE
HCV Ab: NONREACTIVE
Hep A IgM: NONREACTIVE
Hep B C IgM: REACTIVE — AB
Hepatitis B Surface Ag: NONREACTIVE

## 2023-03-25 IMAGING — DX DG CHEST 2V
2 series · 2 of 2 positions shown · non-contrast
Comparison: 03/06/2012

CLINICAL DATA: Cough

EXAM:
CHEST - 2 VIEW

[chest pa]
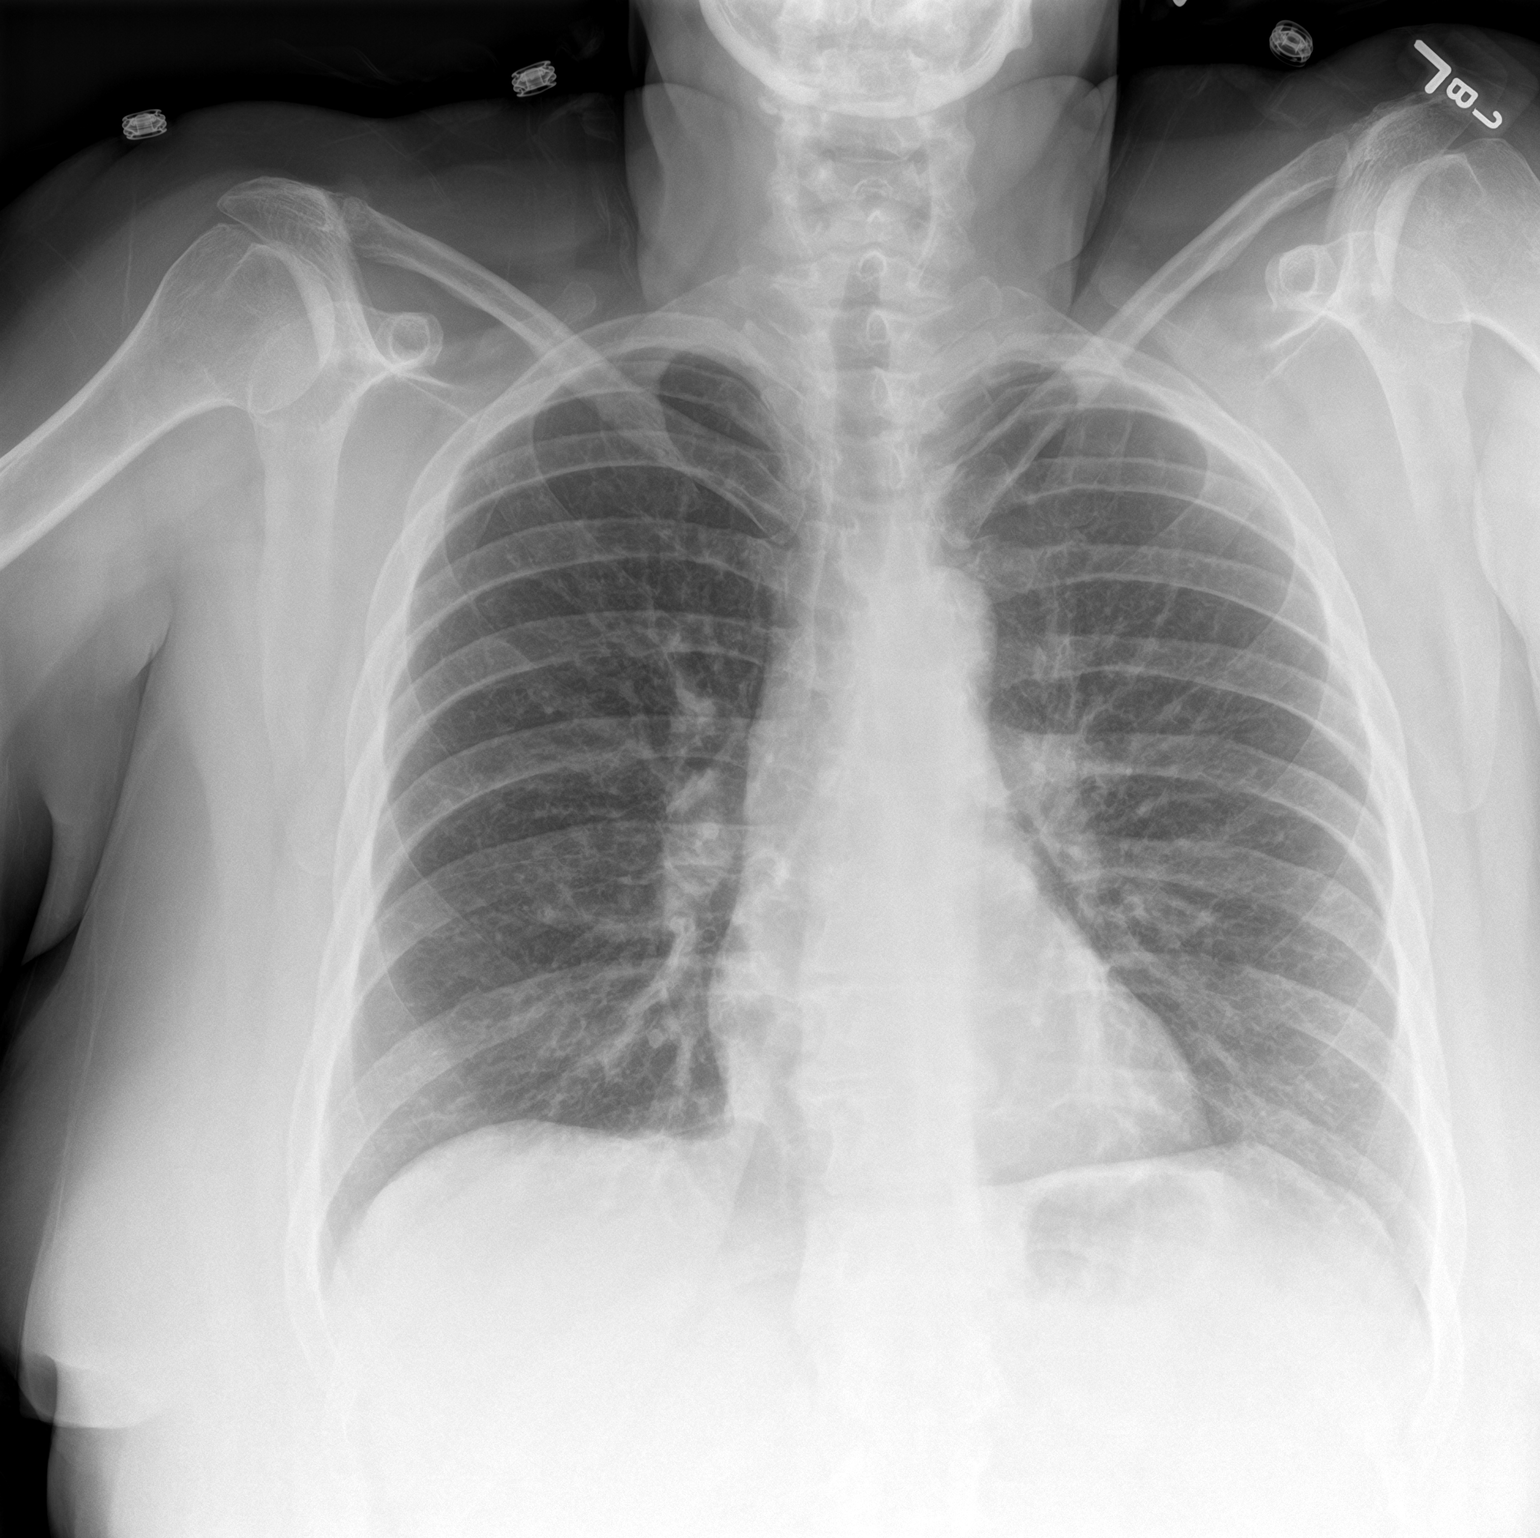

[chest lat]
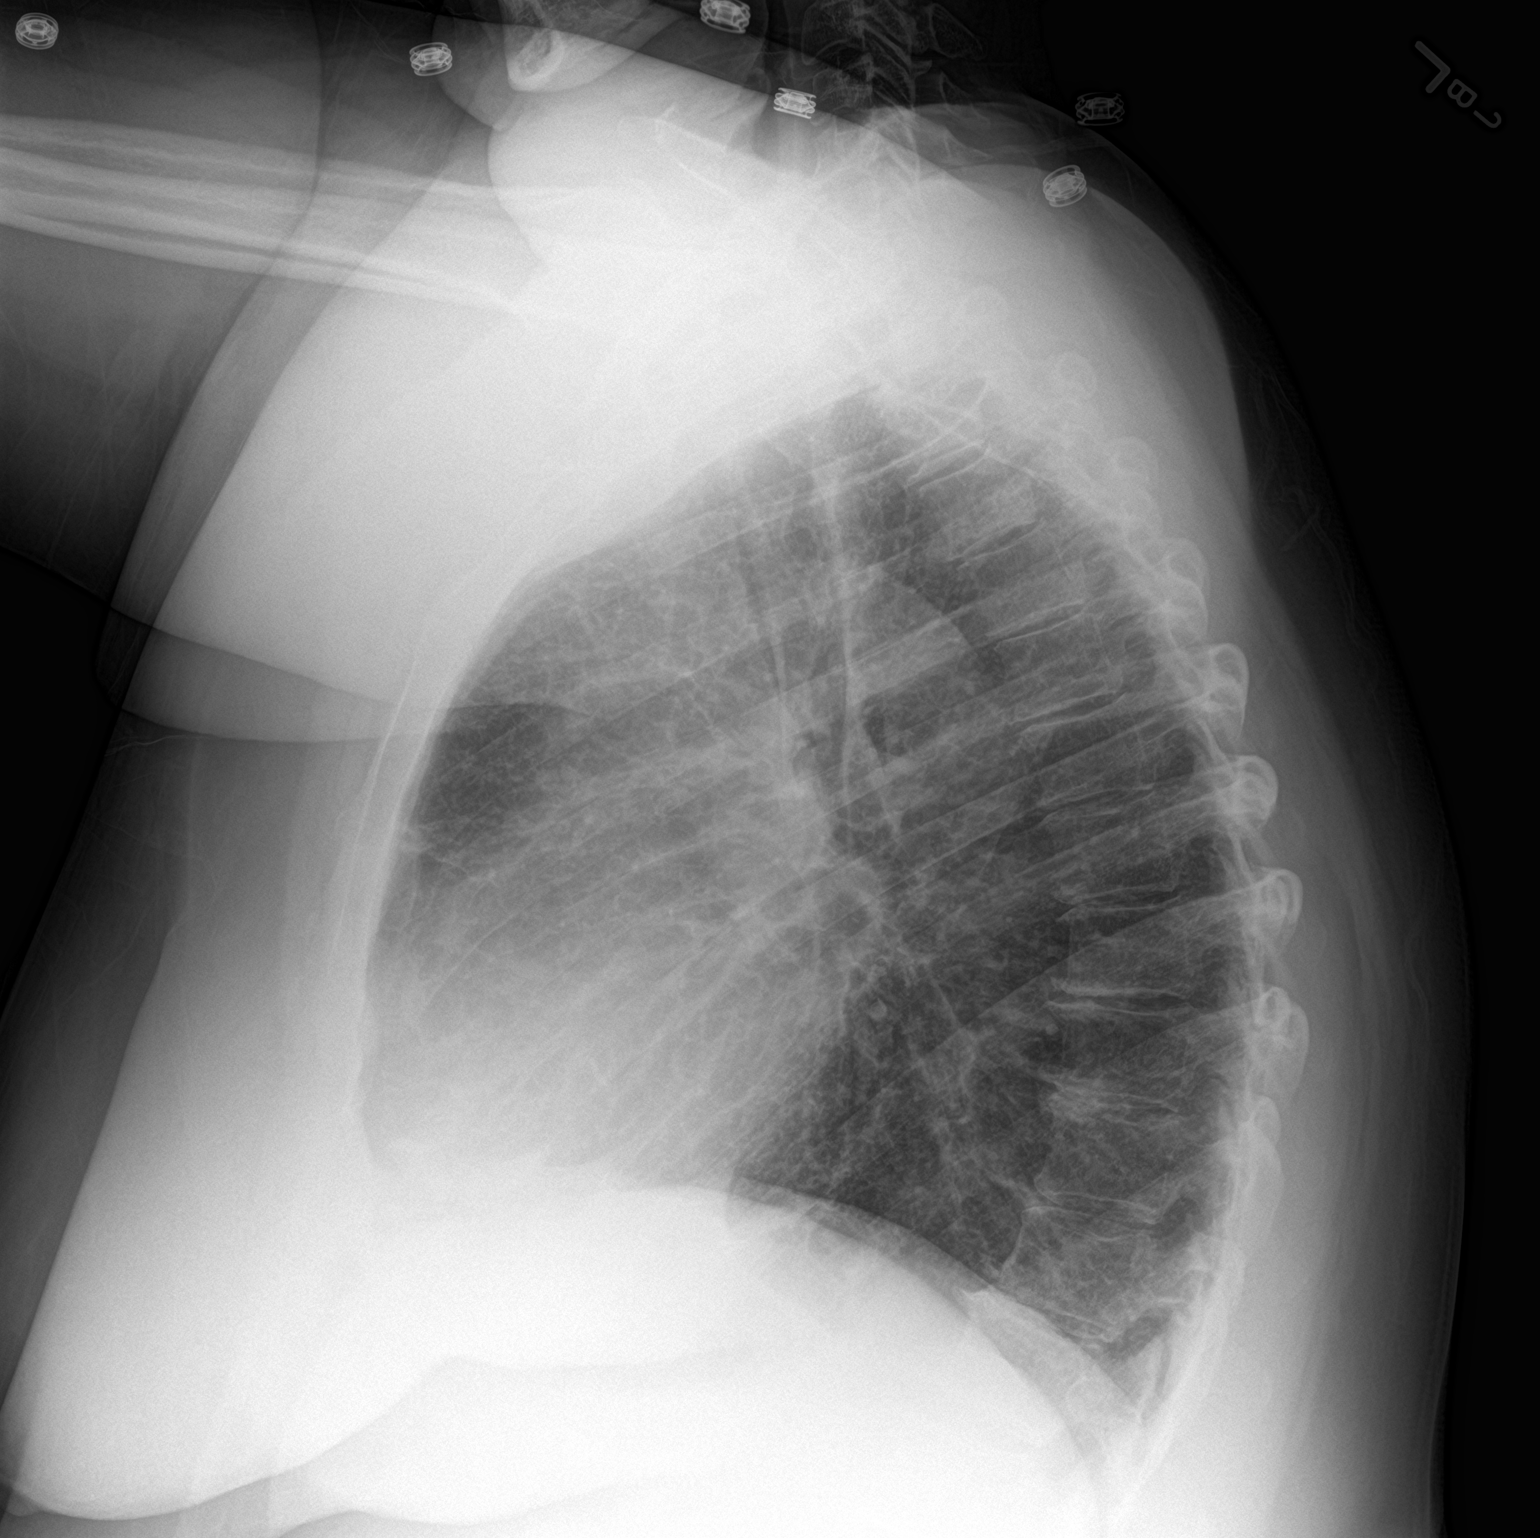

[2 of 2 positions shown; findings below may reference images not displayed]

FINDINGS: The heart size and mediastinal contours are within normal limits.
Both lungs are clear. The visualized skeletal structures are
unremarkable.
IMPRESSION: No active cardiopulmonary disease.

## 2023-04-16 ENCOUNTER — Other Ambulatory Visit (HOSPITAL_COMMUNITY): Payer: Self-pay | Admitting: Nurse Practitioner

## 2023-04-16 DIAGNOSIS — Z1231 Encounter for screening mammogram for malignant neoplasm of breast: Secondary | ICD-10-CM
